# Patient Record
Sex: Female | Born: 1950 | Race: Black or African American | Hispanic: No | Smoking: Former smoker
Health system: Southern US, Community
[De-identification: ages and names within clinical notes are randomized; demographics above are authoritative.]

## PROBLEM LIST (undated history)

## (undated) DIAGNOSIS — E119 Type 2 diabetes mellitus without complications: Secondary | ICD-10-CM

## (undated) HISTORY — DX: Type 2 diabetes mellitus without complications: E11.9

---

## 2001-03-17 ENCOUNTER — Other Ambulatory Visit: Admission: RE | Admit: 2001-03-17 | Discharge: 2001-03-17 | Payer: Self-pay | Admitting: Obstetrics and Gynecology

## 2002-07-18 ENCOUNTER — Other Ambulatory Visit: Admission: RE | Admit: 2002-07-18 | Discharge: 2002-07-18 | Payer: Self-pay | Admitting: Obstetrics and Gynecology

## 2003-09-05 ENCOUNTER — Other Ambulatory Visit: Admission: RE | Admit: 2003-09-05 | Discharge: 2003-09-05 | Payer: Self-pay | Admitting: Obstetrics and Gynecology

## 2012-07-27 ENCOUNTER — Ambulatory Visit (INDEPENDENT_AMBULATORY_CARE_PROVIDER_SITE_OTHER): Payer: Federal, State, Local not specified - PPO | Admitting: Internal Medicine

## 2012-07-27 VITALS — BP 120/68 | HR 92 | Temp 98.5°F | Resp 16 | Ht 64.0 in | Wt 196.4 lb

## 2012-07-27 DIAGNOSIS — M549 Dorsalgia, unspecified: Secondary | ICD-10-CM

## 2012-07-27 DIAGNOSIS — Z6833 Body mass index (BMI) 33.0-33.9, adult: Secondary | ICD-10-CM

## 2012-07-27 DIAGNOSIS — E119 Type 2 diabetes mellitus without complications: Secondary | ICD-10-CM

## 2012-07-27 DIAGNOSIS — IMO0001 Reserved for inherently not codable concepts without codable children: Secondary | ICD-10-CM

## 2012-07-27 MED ORDER — MELOXICAM 15 MG PO TABS: 15.0000 mg | ORAL_TABLET | Freq: Every day | ORAL | Status: AC

## 2012-07-27 MED ORDER — CYCLOBENZAPRINE HCL 10 MG PO TABS: 10.0000 mg | ORAL_TABLET | Freq: Every day | ORAL | Status: AC

## 2012-07-27 NOTE — Progress Notes (Signed)
  Subjective:    Patient ID: Hannah Miller, female    DOB: Nov 23, 1951, 61 y.o.   MRN: [id number]  HPI Pain in the low back started 4 days ago after funny movement work. Got better with stretching exercises, but over the weekend she tried to clean her house and get rid of old things and the pain came back.No radiation. Stiffens at night. Pain worse standing. No chronic back issues   Past medical history-diabetes and hyperlipidemia care for by Dr. Talmage Nap Review of Systems     Objective:   Physical Exam Vital signs stable except obesity Tender over the left iliac crest/worse with reaching Straight leg raise negative bilaterally Flexion and extension not painful Stiff with immediate gait but loosens up       Assessment & Plan:  Problem #1 mild lumbar strain 2 continue exercises Meds ordered this encounter  Medications  . metFORMIN (GLUCOPHAGE) 1000 MG tablet    Sig: Take 1,000 mg by mouth 2 (two) times daily with a meal.  . lisinopril (PRINIVIL,ZESTRIL) 5 MG tablet    Sig: Take 5 mg by mouth daily.  . pravastatin (PRAVACHOL) 10 MG tablet    Sig: Take 10 mg by mouth daily.  . insulin lispro protamine-insulin lispro (HUMALOG 75/25) (75-25) 100 UNIT/ML SUSP    Sig: Inject into the skin.  Marland Kitchen aspirin 81 MG tablet    Sig: Take 81 mg by mouth daily.  . Vitamin D, Ergocalciferol, (DRISDOL) 50000 UNITS CAPS    Sig: Take 50,000 Units by mouth. Dr. Valera Castle care provides all these-Balan   Written today:  . cyclobenzaprine (FLEXERIL) 10 MG tablet    Sig: Take 1 tablet (10 mg total) by mouth at bedtime.    Dispense:  15 tablet    Refill:  0  . meloxicam (MOBIC) 15 MG tablet    Sig: Take 1 tablet (15 mg total) by mouth daily.    Dispense:  21 tablet    Refill:  0  30 minutes of heat at bedtime

## 2014-11-18 ENCOUNTER — Ambulatory Visit (INDEPENDENT_AMBULATORY_CARE_PROVIDER_SITE_OTHER): Payer: Federal, State, Local not specified - PPO | Admitting: Family Medicine

## 2014-11-18 ENCOUNTER — Telehealth: Payer: Self-pay | Admitting: Internal Medicine

## 2014-11-18 VITALS — BP 138/88 | HR 77 | Temp 97.6°F | Resp 16 | Ht 65.0 in | Wt 198.0 lb

## 2014-11-18 DIAGNOSIS — R5383 Other fatigue: Secondary | ICD-10-CM

## 2014-11-18 DIAGNOSIS — F32A Depression, unspecified: Secondary | ICD-10-CM

## 2014-11-18 DIAGNOSIS — G4700 Insomnia, unspecified: Secondary | ICD-10-CM

## 2014-11-18 DIAGNOSIS — F329 Major depressive disorder, single episode, unspecified: Secondary | ICD-10-CM

## 2014-11-18 DIAGNOSIS — E119 Type 2 diabetes mellitus without complications: Secondary | ICD-10-CM

## 2014-11-18 DIAGNOSIS — Z794 Long term (current) use of insulin: Secondary | ICD-10-CM

## 2014-11-18 DIAGNOSIS — IMO0001 Reserved for inherently not codable concepts without codable children: Secondary | ICD-10-CM

## 2014-11-18 MED ORDER — CLONAZEPAM 0.5 MG PO TABS: 0.5000 mg | ORAL_TABLET | Freq: Two times a day (BID) | ORAL | Status: DC | PRN

## 2014-11-18 NOTE — Patient Instructions (Addendum)
Consider seeing a counsellor: George Hugh [phone] Vivia Budge [phone] Hortense Ramal [phone]  Take clonazepam 0.5 mg 1 at bedtime for sleep. If you are tired and think you can go to sleep, try to avoid using the medicines were not necessary.  If you are not improving we will consider adding 1 or 2 other medications to your regimen.  Try to get regular exercise  Avoid all caffeine  Insomnia Insomnia is frequent trouble falling and/or staying asleep. Insomnia can be a long term problem or a short term problem. Both are common. Insomnia can be a short term problem when the wakefulness is related to a certain stress or worry. Long term insomnia is often related to ongoing stress during waking hours and/or poor sleeping habits. Overtime, sleep deprivation itself can make the problem worse. Every little thing feels more severe because you are overtired and your ability to cope is decreased. CAUSES   Stress, anxiety, and depression.  Poor sleeping habits.  Distractions such as TV in the bedroom.  Naps close to bedtime.  Engaging in emotionally charged conversations before bed.  Technical reading before sleep.  Alcohol and other sedatives. They may make the problem worse. They can hurt normal sleep patterns and normal dream activity.  Stimulants such as caffeine for several hours prior to bedtime.  Pain syndromes and shortness of breath can cause insomnia.  Exercise late at night.  Changing time zones may cause sleeping problems (jet lag). It is sometimes helpful to have someone observe your sleeping patterns. They should look for periods of not breathing during the night (sleep apnea). They should also look to see how long those periods last. If you live alone or observers are uncertain, you can also be observed at a sleep clinic where your sleep patterns will be professionally monitored. Sleep apnea requires a checkup and treatment. Give your caregivers  your medical history. Give your caregivers observations your family has made about your sleep.  SYMPTOMS   Not feeling rested in the morning.  Anxiety and restlessness at bedtime.  Difficulty falling and staying asleep. TREATMENT   Your caregiver may prescribe treatment for an underlying medical disorders. Your caregiver can give advice or help if you are using alcohol or other drugs for self-medication. Treatment of underlying problems will usually eliminate insomnia problems.  Medications can be prescribed for short time use. They are generally not recommended for lengthy use.  Over-the-counter sleep medicines are not recommended for lengthy use. They can be habit forming.  You can promote easier sleeping by making lifestyle changes such as:  Using relaxation techniques that help with breathing and reduce muscle tension.  Exercising earlier in the day.  Changing your diet and the time of your last meal. No night time snacks.  Establish a regular time to go to bed.  Counseling can help with stressful problems and worry.  Soothing music and white noise may be helpful if there are background noises you cannot remove.  Stop tedious detailed work at least one hour before bedtime. HOME CARE INSTRUCTIONS   Keep a diary. Inform your caregiver about your progress. This includes any medication side effects. See your caregiver regularly. Take note of:  Times when you are asleep.  Times when you are awake during the night.  The quality of your sleep.  How you feel the next day. This information will help your caregiver care for you.  Get out of bed if you are still awake after 15 minutes. Read  or do some quiet activity. Keep the lights down. Wait until you feel sleepy and go back to bed.  Keep regular sleeping and waking hours. Avoid naps.  Exercise regularly.  Avoid distractions at bedtime. Distractions include watching television or engaging in any intense or detailed  activity like attempting to balance the household checkbook.  Develop a bedtime ritual. Keep a familiar routine of bathing, brushing your teeth, climbing into bed at the same time each night, listening to soothing music. Routines increase the success of falling to sleep faster.  Use relaxation techniques. This can be using breathing and muscle tension release routines. It can also include visualizing peaceful scenes. You can also help control troubling or intruding thoughts by keeping your mind occupied with boring or repetitive thoughts like the old concept of counting sheep. You can make it more creative like imagining planting one beautiful flower after another in your backyard garden.  During your day, work to eliminate stress. When this is not possible use some of the previous suggestions to help reduce the anxiety that accompanies stressful situations. MAKE SURE YOU:   Understand these instructions.  Will watch your condition.  Will get help right away if you are not doing well or get worse. Document Released: 11/07/2000 Document Revised: 02/02/2012 Document Reviewed: 12/08/2007 [facility] Patient Information 2015 [place], [place]. This information is not intended to replace advice given to you by your health care provider. Make sure you discuss any questions you have with your health care provider.

## 2014-11-18 NOTE — Telephone Encounter (Signed)
Error

## 2014-11-18 NOTE — Progress Notes (Signed)
Subjective: 63 year old lady who is here because of her problems sleeping. She has a long history of troubles with this intermittently. She saw Dr. Laney Pastor about this 6 years ago, was treated with some clonazepam. She has been under a lot of work stress, as she was back then. She is diabetic and sees Dr. Chalmers Cater as her primary diabetic doctor. She lives alone. Does not know if she snores. She is awake off and on all through the night. Usually awakens before her alarm clock. Goes to bed at 11:00 in is trying to get up and around 6 AM. She does not smoke she does not drink alcohol. She has not had sleeping medications for a long time. She has tried treating herself unsuccessfully. She does admit to depression, cries fairly often. She is active in her church. Has not been seeing any counselor or psychologist. She does not get any regular physical exercise  She does like to read  Objective: Pleasant lady in no major distress today. Alert and oriented. Moderately overweight. Her neck is supple without nodes or thyromegaly. Chest clear. Heart rate without murmurs.  Assessment: Insomnia Depression Type 2 diabetes mellitus Fatigue  Plan: Stressed the need for regular exercise. She should use regular sleep habits, and we gave her a handout on this. Give clonazepam 0.5 mg at bedtime. If she's not doing better with that consider adding an SSRI for depression and/or some trazodone in addition to the SSRI from sleep purposes. She should return in about a month to review how she is doing and decide whether we need to arrange a longer-term sleep plan.  She apparently hopes to be able to retire in a year or 2, showing get away from the stresses that she feels constantly under. Does not have any major plans for in retirement as of now.  Recommend considering counseling and gave her several names that she can contact if she wishes to, or she can find someone else

## 2015-04-24 ENCOUNTER — Ambulatory Visit (INDEPENDENT_AMBULATORY_CARE_PROVIDER_SITE_OTHER): Payer: Federal, State, Local not specified - PPO | Admitting: Internal Medicine

## 2015-04-24 ENCOUNTER — Ambulatory Visit (INDEPENDENT_AMBULATORY_CARE_PROVIDER_SITE_OTHER): Payer: Federal, State, Local not specified - PPO

## 2015-04-24 VITALS — BP 124/78 | HR 67 | Temp 98.6°F | Resp 16 | Ht 65.0 in | Wt 201.8 lb

## 2015-04-24 DIAGNOSIS — M545 Low back pain, unspecified: Secondary | ICD-10-CM

## 2015-04-24 MED ORDER — CYCLOBENZAPRINE HCL 10 MG PO TABS: 10.0000 mg | ORAL_TABLET | Freq: Every day | ORAL | Status: DC

## 2015-04-24 MED ORDER — MELOXICAM 15 MG PO TABS: 15.0000 mg | ORAL_TABLET | Freq: Every day | ORAL | Status: DC

## 2015-04-24 NOTE — Progress Notes (Addendum)
   Subjective:    Patient ID: Hannah Miller, female    DOB: 12/20/1950, 64 y.o.   MRN: [id number] This chart was scribed for Tami Lin, MD by Marti Sleigh, Medical Scribe. This patient was seen in Room 5 and the patient's care was started a 6:39 PM.  Chief Complaint  Patient presents with  . Follow-up    nerve in lower back    HPI HPI Comments: Hannah Miller is a 64 y.o. female with a past hx of back pain who presents to [facility] complaining of back pain at the base of her back for the last six months. Pt states her back pain is different this time than it has been in the past. Pt states when she walks or stands for an extended period she has pain and her back locks up. Pt states she has to sit down to release her back. Pt states she has no difficulty getting in or our of the car. Pt states she has no pain with sitting. Pt denies balance issues, numbness in extremities, or burning in feet.   No gu issues iDDM stable tho recent incr ins needed perPCP  Review of Systems  Constitutional: Negative for fever, chills and unexpected weight change.  Respiratory: Negative for shortness of breath.   Cardiovascular: Negative for chest pain and leg swelling.  Gastrointestinal: Negative for abdominal pain and constipation.  Genitourinary: Negative for difficulty urinating.  Musculoskeletal: Positive for back pain. Negative for arthralgias and neck pain.  Neurological: Negative for weakness and numbness.       Objective:   Physical Exam  Constitutional: She is oriented to person, place, and time. She appears well-developed and well-nourished. No distress.  HENT:  Head: Normocephalic and atraumatic.  Eyes: EOM are normal. Pupils are equal, round, and reactive to light.  Neck: Normal range of motion. Neck supple.  Cardiovascular: Normal rate.   Pulmonary/Chest: Effort normal. No respiratory distress.  Musculoskeletal:  Only sl tend over lumbar--pain with forward bend Twist/extens good SLR  90 bilat No sens/mot losses in extr DTRs 0/0 patellar and ankle Gait wnl  Neurological: She is alert and oriented to person, place, and time. No cranial nerve deficit. Coordination normal.  Skin: Skin is warm and dry. She is not diaphoretic.  Psychiatric: She has a normal mood and affect. Her behavior is normal.  Nursing note and vitals reviewed.  [facility] reading (PRIMARY) by  Dr. Naheim Burgen=No bony lesions--?gallstone RUQ- i informed her-is asymptomat  Wt Readings from Last 3 Encounters:  04/24/15 201 lb 12.8 oz (91.536 kg)  11/18/14 198 lb (89.812 kg)  07/27/12 196 lb 6.4 oz (89.086 kg)        Assessment & Plan:  Pain of lumbar spine - Plan: DG Lumbar Spine Complete  Meds ordered this encounter  Medications  . cyclobenzaprine (FLEXERIL) 10 MG tablet    Sig: Take 1 tablet (10 mg total) by mouth at bedtime.    Dispense:  30 tablet    Refill:  0  . meloxicam (MOBIC) 15 MG tablet    Sig: Take 1 tablet (15 mg total) by mouth daily.    Dispense:  30 tablet    Refill:  0   Stretching bid Heat 30 bid If not well 2 weeks ref to PT(can't garden or lean over to wash dishes)  I have completed the patient encounter in its entirety as documented by the scribe, with editing by me where necessary. Sayler Mickiewicz P. Laney Pastor, M.D.

## 2015-05-30 ENCOUNTER — Other Ambulatory Visit: Payer: Self-pay | Admitting: Internal Medicine

## 2017-03-06 DIAGNOSIS — E559 Vitamin D deficiency, unspecified: Secondary | ICD-10-CM | POA: Diagnosis not present

## 2017-03-06 DIAGNOSIS — E1165 Type 2 diabetes mellitus with hyperglycemia: Secondary | ICD-10-CM | POA: Diagnosis not present

## 2017-03-06 DIAGNOSIS — E7800 Pure hypercholesterolemia, unspecified: Secondary | ICD-10-CM | POA: Diagnosis not present

## 2017-03-06 DIAGNOSIS — I10 Essential (primary) hypertension: Secondary | ICD-10-CM | POA: Diagnosis not present

## 2017-04-30 DIAGNOSIS — R109 Unspecified abdominal pain: Secondary | ICD-10-CM | POA: Diagnosis not present

## 2017-04-30 DIAGNOSIS — M25511 Pain in right shoulder: Secondary | ICD-10-CM | POA: Diagnosis not present

## 2017-04-30 DIAGNOSIS — M542 Cervicalgia: Secondary | ICD-10-CM | POA: Diagnosis not present

## 2017-08-04 DIAGNOSIS — E1165 Type 2 diabetes mellitus with hyperglycemia: Secondary | ICD-10-CM | POA: Diagnosis not present

## 2017-08-04 DIAGNOSIS — I10 Essential (primary) hypertension: Secondary | ICD-10-CM | POA: Diagnosis not present

## 2017-08-04 DIAGNOSIS — E559 Vitamin D deficiency, unspecified: Secondary | ICD-10-CM | POA: Diagnosis not present

## 2017-08-04 DIAGNOSIS — E7800 Pure hypercholesterolemia, unspecified: Secondary | ICD-10-CM | POA: Diagnosis not present

## 2017-09-07 DIAGNOSIS — E7800 Pure hypercholesterolemia, unspecified: Secondary | ICD-10-CM | POA: Diagnosis not present

## 2017-09-07 DIAGNOSIS — I10 Essential (primary) hypertension: Secondary | ICD-10-CM | POA: Diagnosis not present

## 2017-09-07 DIAGNOSIS — E559 Vitamin D deficiency, unspecified: Secondary | ICD-10-CM | POA: Diagnosis not present

## 2017-09-07 DIAGNOSIS — E1165 Type 2 diabetes mellitus with hyperglycemia: Secondary | ICD-10-CM | POA: Diagnosis not present

## 2018-02-25 DIAGNOSIS — I10 Essential (primary) hypertension: Secondary | ICD-10-CM | POA: Diagnosis not present

## 2018-02-25 DIAGNOSIS — E559 Vitamin D deficiency, unspecified: Secondary | ICD-10-CM | POA: Diagnosis not present

## 2018-02-25 DIAGNOSIS — E1165 Type 2 diabetes mellitus with hyperglycemia: Secondary | ICD-10-CM | POA: Diagnosis not present

## 2018-02-25 DIAGNOSIS — E7800 Pure hypercholesterolemia, unspecified: Secondary | ICD-10-CM | POA: Diagnosis not present

## 2018-05-24 DIAGNOSIS — I10 Essential (primary) hypertension: Secondary | ICD-10-CM | POA: Diagnosis not present

## 2018-05-24 DIAGNOSIS — E1165 Type 2 diabetes mellitus with hyperglycemia: Secondary | ICD-10-CM | POA: Diagnosis not present

## 2018-05-24 DIAGNOSIS — E559 Vitamin D deficiency, unspecified: Secondary | ICD-10-CM | POA: Diagnosis not present

## 2018-05-24 DIAGNOSIS — E7800 Pure hypercholesterolemia, unspecified: Secondary | ICD-10-CM | POA: Diagnosis not present

## 2018-07-29 DIAGNOSIS — E1165 Type 2 diabetes mellitus with hyperglycemia: Secondary | ICD-10-CM | POA: Diagnosis not present

## 2018-07-29 DIAGNOSIS — E559 Vitamin D deficiency, unspecified: Secondary | ICD-10-CM | POA: Diagnosis not present

## 2018-07-29 DIAGNOSIS — Z139 Encounter for screening, unspecified: Secondary | ICD-10-CM | POA: Diagnosis not present

## 2018-07-29 DIAGNOSIS — I10 Essential (primary) hypertension: Secondary | ICD-10-CM | POA: Diagnosis not present

## 2018-07-29 DIAGNOSIS — E7800 Pure hypercholesterolemia, unspecified: Secondary | ICD-10-CM | POA: Diagnosis not present

## 2018-09-02 ENCOUNTER — Other Ambulatory Visit: Payer: Self-pay | Admitting: Endocrinology

## 2018-09-02 DIAGNOSIS — Z1231 Encounter for screening mammogram for malignant neoplasm of breast: Secondary | ICD-10-CM

## 2018-10-27 ENCOUNTER — Other Ambulatory Visit: Payer: Self-pay | Admitting: Endocrinology

## 2018-10-27 DIAGNOSIS — M810 Age-related osteoporosis without current pathological fracture: Secondary | ICD-10-CM

## 2018-12-09 DIAGNOSIS — E7800 Pure hypercholesterolemia, unspecified: Secondary | ICD-10-CM | POA: Diagnosis not present

## 2018-12-09 DIAGNOSIS — I10 Essential (primary) hypertension: Secondary | ICD-10-CM | POA: Diagnosis not present

## 2018-12-09 DIAGNOSIS — E559 Vitamin D deficiency, unspecified: Secondary | ICD-10-CM | POA: Diagnosis not present

## 2018-12-09 DIAGNOSIS — E1165 Type 2 diabetes mellitus with hyperglycemia: Secondary | ICD-10-CM | POA: Diagnosis not present

## 2018-12-15 DIAGNOSIS — E7800 Pure hypercholesterolemia, unspecified: Secondary | ICD-10-CM | POA: Diagnosis not present

## 2018-12-15 DIAGNOSIS — E559 Vitamin D deficiency, unspecified: Secondary | ICD-10-CM | POA: Diagnosis not present

## 2018-12-15 DIAGNOSIS — E1165 Type 2 diabetes mellitus with hyperglycemia: Secondary | ICD-10-CM | POA: Diagnosis not present

## 2018-12-27 ENCOUNTER — Ambulatory Visit (HOSPITAL_COMMUNITY)
Admission: EM | Admit: 2018-12-27 | Discharge: 2018-12-27 | Disposition: A | Discharge status: Home / Self Care | Payer: Medicare Other | Attending: Family Medicine | Admitting: Family Medicine

## 2018-12-27 ENCOUNTER — Encounter (HOSPITAL_COMMUNITY): Payer: Self-pay | Admitting: Emergency Medicine

## 2018-12-27 ENCOUNTER — Other Ambulatory Visit: Payer: Self-pay

## 2018-12-27 DIAGNOSIS — J069 Acute upper respiratory infection, unspecified: Secondary | ICD-10-CM

## 2018-12-27 MED ORDER — HYDROCODONE-HOMATROPINE 5-1.5 MG/5ML PO SYRP: 5.0000 mL | ORAL_SOLUTION | Freq: Four times a day (QID) | ORAL | 0 refills | Status: DC | PRN

## 2018-12-27 MED ORDER — ONDANSETRON 4 MG PO TBDP
4.0000 mg | ORAL_TABLET | Freq: Once | ORAL | Status: AC
  Administered 2018-12-27: 4 mg via ORAL

## 2018-12-27 MED ORDER — ONDANSETRON 4 MG PO TBDP: 4.0000 mg | ORAL_TABLET | Freq: Three times a day (TID) | ORAL | 0 refills | Status: DC | PRN

## 2018-12-27 MED ORDER — ONDANSETRON 4 MG PO TBDP
ORAL_TABLET | ORAL | Status: AC
  Filled 2018-12-27: qty 1

## 2018-12-27 NOTE — ED Triage Notes (Signed)
Pt reports a cough that started on Friday.  Pt states she has since developed nausea, vomiting, and dizziness with a headache.  Pt has not had a fever.

## 2018-12-27 NOTE — ED Provider Notes (Signed)
Hannah Miller    CSN: [id number] Arrival date & time: 12/27/18  1248     History   Chief Complaint Chief Complaint  Patient presents with  . URI    HPI Hannah Miller is a 68 y.o. female.  Patient has had dry cough with nausea vomiting diarrhea and headache.  Symptoms started about 3 days ago.  She denies myalgias sore throat.  She has not used any OTC medicines   HPI  Past Medical History:  Diagnosis Date  . Diabetes mellitus without complication [facility])     Patient Active Problem List   Diagnosis Date Noted  . IDDM (insulin dependent diabetes mellitus) ([place]) 07/27/2012  . BMI 33.0-33.9,adult 07/27/2012    History reviewed. No pertinent surgical history.  OB History   No obstetric history on file.      Home Medications    Prior to Admission medications   Medication Sig Start Date End Date Taking? Authorizing Provider  aspirin 81 MG tablet Take 81 mg by mouth daily.   Yes [provider]  insulin lispro protamine-insulin lispro (HUMALOG 75/25) (75-25) 100 UNIT/ML SUSP Inject into the skin.   Yes [provider]  lisinopril (PRINIVIL,ZESTRIL) 5 MG tablet Take 5 mg by mouth daily.   Yes [provider]  metFORMIN (GLUCOPHAGE) 1000 MG tablet Take 1,000 mg by mouth 2 (two) times daily with a meal.   Yes [provider]  pravastatin (PRAVACHOL) 10 MG tablet Take 10 mg by mouth daily.   Yes [provider]  Vitamin D, Ergocalciferol, (DRISDOL) 50000 UNITS CAPS Take 50,000 Units by mouth.   Yes [provider]  cyclobenzaprine (FLEXERIL) 10 MG tablet Take 1 tablet (10 mg total) by mouth at bedtime. 04/24/15   Leandrew Koyanagi, MD  meloxicam (MOBIC) 15 MG tablet Take 1 tablet (15 mg total) by mouth daily. 04/24/15   Leandrew Koyanagi, MD    Family History History reviewed. No pertinent family history.  Social History Social History   Tobacco Use  . Smoking status: Former Smoker  Substance Use Topics    . Alcohol use: Not on file  . Drug use: Not on file     Allergies   Patient has no known allergies.   Review of Systems Review of Systems  Constitutional: Positive for fever.  Respiratory: Positive for cough.   Gastrointestinal: Positive for diarrhea, nausea and vomiting.  All other systems reviewed and are negative.    Physical Exam Triage Vital Signs ED Triage Vitals  Enc Vitals Group     BP 12/27/18 1438 110/79     Pulse Rate 12/27/18 1438 85     Resp --      Temp 12/27/18 1438 99.3 F (37.4 C)     Temp Source 12/27/18 1438 Oral     SpO2 12/27/18 1438 98 %     Weight --      Height --      Head Circumference --      Peak Flow --      Pain Score 12/27/18 1439 0     Pain Loc --      Pain Edu? --      Excl. in [place]? --    No data found.  Updated Vital Signs BP 110/79 (BP Location: Right Arm)   Pulse 85   Temp 99.3 F (37.4 C) (Oral)   SpO2 98%   Visual Acuity Right Eye Distance:   Left Eye Distance:   Bilateral Distance:  Right Eye Near:   Left Eye Near:    Bilateral Near:     Physical Exam Vitals signs and nursing note reviewed. Exam conducted with a chaperone present.  Constitutional:      Appearance: Normal appearance. She is obese.  HENT:     Head: Normocephalic.     Right Ear: Tympanic membrane normal.     Left Ear: Tympanic membrane normal.     Nose: Nose normal.     Mouth/Throat:     Mouth: Mucous membranes are moist.     Pharynx: Oropharynx is clear.  Neck:     Musculoskeletal: Normal range of motion and neck supple.  Cardiovascular:     Rate and Rhythm: Normal rate and regular rhythm.     Heart sounds: Normal heart sounds.  Pulmonary:     Effort: Pulmonary effort is normal.     Breath sounds: Normal breath sounds.  Abdominal:     General: Bowel sounds are normal.  Neurological:     General: No focal deficit present.     Mental Status: She is alert and oriented to person, place, and time.      UC Treatments / Results   Labs (all labs ordered are listed, but only abnormal results are displayed) Labs Reviewed - No data to display  EKG None  Radiology No results found.  Procedures Procedures (including critical care time)  Medications Ordered in UC Medications  ondansetron (ZOFRAN-ODT) disintegrating tablet 4 mg (has no administration in time range)    Initial Impression / Assessment and Plan / UC Course  I have reviewed the triage vital signs and the nursing notes.  Pertinent labs & imaging results that were available during my care of the patient were reviewed by me and considered in my medical decision making (see chart for details).     Viral syndrome.  Will treat symptoms Final Clinical Impressions(s) / UC Diagnoses   Final diagnoses:  None   Discharge Instructions   None    ED Prescriptions    None     Controlled Substance Prescriptions [place] Controlled Substance Registry consulted? Yes, I have consulted the [place] Controlled Substances Registry for this patient, and feel the risk/benefit ratio today is favorable for proceeding with this prescription for a controlled substance.   Wardell Honour, MD 12/27/18 [phone]

## 2019-01-05 ENCOUNTER — Ambulatory Visit: Payer: Self-pay

## 2019-01-05 ENCOUNTER — Other Ambulatory Visit: Payer: Self-pay

## 2020-07-30 ENCOUNTER — Ambulatory Visit (HOSPITAL_COMMUNITY)
Admission: EM | Admit: 2020-07-30 | Discharge: 2020-07-30 | Disposition: A | Discharge status: Home / Self Care | Payer: Medicare Other | Attending: Physician Assistant | Admitting: Physician Assistant

## 2020-07-30 ENCOUNTER — Encounter (HOSPITAL_COMMUNITY): Payer: Self-pay

## 2020-07-30 ENCOUNTER — Other Ambulatory Visit: Payer: Self-pay

## 2020-07-30 DIAGNOSIS — M545 Low back pain, unspecified: Secondary | ICD-10-CM

## 2020-07-30 MED ORDER — TIZANIDINE HCL 2 MG PO TABS: 2.0000 mg | ORAL_TABLET | Freq: Three times a day (TID) | ORAL | 0 refills | Status: DC | PRN

## 2020-07-30 MED ORDER — MELOXICAM 15 MG PO TABS: 15.0000 mg | ORAL_TABLET | Freq: Every day | ORAL | 0 refills | Status: DC

## 2020-07-30 NOTE — ED Triage Notes (Addendum)
Pt is here with omn & off back pain that she has had for 20 years, pt has not  taken any meds to relieve discomfort.

## 2020-07-30 NOTE — ED Provider Notes (Signed)
[place]    CSN: [id number] Arrival date & time: 07/30/20  2841      History   Chief Complaint Chief Complaint  Patient presents with  . Back Pain    HPI Hannah Miller is a 69 y.o. female.   69 year old female comes in for a chronic right lower back pain.  Patient states this has been intermittent for the past 20 years, with 3 episodes of flareups.  Current episode has been for a few months.  No injury or trauma.  In the past, has done NSAIDs, muscle relaxers, PT with relief.  States in the past few months, she has been trying to do stretches and has not improved symptoms.  Pain is to the right lower back with shooting and burning sensation towards the hip area.  Denies radiation down the leg, numbness, tingling, saddle anesthesia, loss of bladder or bowel control.     Past Medical History:  Diagnosis Date  . Diabetes mellitus without complication [facility])     Patient Active Problem List   Diagnosis Date Noted  . IDDM (insulin dependent diabetes mellitus) 07/27/2012  . BMI 33.0-33.9,adult 07/27/2012    History reviewed. No pertinent surgical history.  OB History   No obstetric history on file.      Home Medications    Prior to Admission medications   Medication Sig Start Date End Date Taking? Authorizing Provider  aspirin 81 MG tablet Take 81 mg by mouth daily.    [provider]  insulin lispro protamine-insulin lispro (HUMALOG 75/25) (75-25) 100 UNIT/ML SUSP Inject into the skin.    [provider]  lisinopril (PRINIVIL,ZESTRIL) 5 MG tablet Take 5 mg by mouth daily.    [provider]  meloxicam (MOBIC) 15 MG tablet Take 1 tablet (15 mg total) by mouth daily. 07/30/20   Tasia Catchings, Griselda Tosh V, PA-C  pravastatin (PRAVACHOL) 10 MG tablet Take 10 mg by mouth daily.    [provider]  tiZANidine (ZANAFLEX) 2 MG tablet Take 1 tablet (2 mg total) by mouth every 8 (eight) hours as needed for muscle spasms. 07/30/20   Tasia Catchings, Zamia Tyminski V, PA-C    Vitamin D, Ergocalciferol, (DRISDOL) 50000 UNITS CAPS Take 50,000 Units by mouth.    [provider]  metFORMIN (GLUCOPHAGE) 1000 MG tablet Take 1,000 mg by mouth 2 (two) times daily with a meal.  07/30/20  [provider]    Family History Family History  Problem Relation Age of Onset  . Healthy Mother   . Healthy Father     Social History Social History   Tobacco Use  . Smoking status: Former Smoker    Types: Cigarettes  . Smokeless tobacco: Never Used  Substance Use Topics  . Alcohol use: Not Currently  . Drug use: Never     Allergies   Patient has no known allergies.   Review of Systems Review of Systems  Reason unable to perform ROS: See HPI as above.     Physical Exam Triage Vital Signs ED Triage Vitals [07/30/20 1014]  Enc Vitals Group     BP (!) 152/70     Pulse Rate 77     Resp 19     Temp 98.8 F (37.1 C)     Temp Source Oral     SpO2 99 %     Weight      Height      Head Circumference      Peak Flow  Pain Score      Pain Loc      Pain Edu?      Excl. in [place]?    No data found.  Updated Vital Signs BP (!) 152/70 (BP Location: Right Arm)   Pulse 77   Temp 98.8 F (37.1 C) (Oral)   Resp 19   SpO2 99%   Physical Exam Constitutional:      General: She is not in acute distress.    Appearance: She is well-developed. She is not diaphoretic.  HENT:     Head: Normocephalic and atraumatic.  Eyes:     Conjunctiva/sclera: Conjunctivae normal.     Pupils: Pupils are equal, round, and reactive to light.  Cardiovascular:     Rate and Rhythm: Normal rate and regular rhythm.     Heart sounds: Normal heart sounds. No murmur heard.  No friction rub. No gallop.   Pulmonary:     Effort: Pulmonary effort is normal. No accessory muscle usage or respiratory distress.     Breath sounds: Normal breath sounds. No stridor. No decreased breath sounds, wheezing, rhonchi or rales.  Musculoskeletal:     Comments: No tenderness on  palpation of the spinous processes. Tenderness to palpation along right lumbar region. No tenderness to the hip. Full range of motion of back/hips. Strength normal and equal bilaterally. Sensation intact and equal bilaterally. Negative straight leg raise.  Skin:    General: Skin is warm and dry.  Neurological:     Mental Status: She is alert and oriented to person, place, and time.      UC Treatments / Results  Labs (all labs ordered are listed, but only abnormal results are displayed) Labs Reviewed - No data to display  EKG   Radiology No results found.  Procedures Procedures (including critical care time)  Medications Ordered in UC Medications - No data to display  Initial Impression / Assessment and Plan / UC Course  I have reviewed the triage vital signs and the nursing notes.  Pertinent labs & imaging results that were available during my care of the patient were reviewed by me and considered in my medical decision making (see chart for details).    NSAID as directed. Muscle relaxant as needed. Ice/heat compresses. Expected course of healing discussed. Return precautions given.   Final Clinical Impressions(s) / UC Diagnoses   Final diagnoses:  Acute right-sided low back pain without sciatica   ED Prescriptions    Medication Sig Dispense Auth. Provider   meloxicam (MOBIC) 15 MG tablet Take 1 tablet (15 mg total) by mouth daily. 10 tablet Sharunda Salmon V, PA-C   tiZANidine (ZANAFLEX) 2 MG tablet Take 1 tablet (2 mg total) by mouth every 8 (eight) hours as needed for muscle spasms. 15 tablet Ok Edwards, PA-C     PDMP not reviewed this encounter.   Ok Edwards, PA-C 07/30/20 1054

## 2020-07-30 NOTE — Discharge Instructions (Signed)
Start Mobic. Do not take ibuprofen (motrin/advil)/ naproxen (aleve) while on mobic. Robaxin as needed, this can make you drowsy, so do not take if you are going to drive, operate heavy machinery, or make important decisions. Ice/heat compresses as needed. Follow up with PCP/orthopedics if symptoms worsen, changes for reevaluation. If experience numbness/tingling of the inner thighs, loss of bladder or bowel control, go to the emergency department for evaluation.

## 2020-11-20 DIAGNOSIS — E1165 Type 2 diabetes mellitus with hyperglycemia: Secondary | ICD-10-CM | POA: Diagnosis not present

## 2020-11-20 DIAGNOSIS — E7800 Pure hypercholesterolemia, unspecified: Secondary | ICD-10-CM | POA: Diagnosis not present

## 2020-11-20 DIAGNOSIS — E559 Vitamin D deficiency, unspecified: Secondary | ICD-10-CM | POA: Diagnosis not present

## 2020-11-28 DIAGNOSIS — I10 Essential (primary) hypertension: Secondary | ICD-10-CM | POA: Diagnosis not present

## 2020-11-28 DIAGNOSIS — E1165 Type 2 diabetes mellitus with hyperglycemia: Secondary | ICD-10-CM | POA: Diagnosis not present

## 2020-11-28 DIAGNOSIS — E7800 Pure hypercholesterolemia, unspecified: Secondary | ICD-10-CM | POA: Diagnosis not present

## 2020-11-28 DIAGNOSIS — E559 Vitamin D deficiency, unspecified: Secondary | ICD-10-CM | POA: Diagnosis not present

## 2021-01-10 DIAGNOSIS — I10 Essential (primary) hypertension: Secondary | ICD-10-CM | POA: Diagnosis not present

## 2021-01-10 DIAGNOSIS — E559 Vitamin D deficiency, unspecified: Secondary | ICD-10-CM | POA: Diagnosis not present

## 2021-01-10 DIAGNOSIS — E7800 Pure hypercholesterolemia, unspecified: Secondary | ICD-10-CM | POA: Diagnosis not present

## 2021-01-10 DIAGNOSIS — E1165 Type 2 diabetes mellitus with hyperglycemia: Secondary | ICD-10-CM | POA: Diagnosis not present

## 2021-04-16 DIAGNOSIS — E1165 Type 2 diabetes mellitus with hyperglycemia: Secondary | ICD-10-CM | POA: Diagnosis not present

## 2021-04-16 DIAGNOSIS — I10 Essential (primary) hypertension: Secondary | ICD-10-CM | POA: Diagnosis not present

## 2021-04-16 DIAGNOSIS — E7800 Pure hypercholesterolemia, unspecified: Secondary | ICD-10-CM | POA: Diagnosis not present

## 2021-04-16 DIAGNOSIS — E559 Vitamin D deficiency, unspecified: Secondary | ICD-10-CM | POA: Diagnosis not present

## 2021-06-20 DIAGNOSIS — M5412 Radiculopathy, cervical region: Secondary | ICD-10-CM | POA: Diagnosis not present

## 2021-06-20 DIAGNOSIS — G5603 Carpal tunnel syndrome, bilateral upper limbs: Secondary | ICD-10-CM | POA: Diagnosis not present

## 2021-06-20 DIAGNOSIS — M546 Pain in thoracic spine: Secondary | ICD-10-CM | POA: Diagnosis not present

## 2021-07-01 ENCOUNTER — Ambulatory Visit: Payer: Medicare Other | Attending: Urgent Care

## 2021-07-01 ENCOUNTER — Other Ambulatory Visit: Payer: Self-pay

## 2021-07-01 DIAGNOSIS — R200 Anesthesia of skin: Secondary | ICD-10-CM | POA: Insufficient documentation

## 2021-07-01 DIAGNOSIS — M79631 Pain in right forearm: Secondary | ICD-10-CM | POA: Insufficient documentation

## 2021-07-01 DIAGNOSIS — R293 Abnormal posture: Secondary | ICD-10-CM | POA: Diagnosis not present

## 2021-07-01 DIAGNOSIS — M6281 Muscle weakness (generalized): Secondary | ICD-10-CM | POA: Diagnosis not present

## 2021-07-01 DIAGNOSIS — M79632 Pain in left forearm: Secondary | ICD-10-CM | POA: Diagnosis not present

## 2021-07-01 DIAGNOSIS — R202 Paresthesia of skin: Secondary | ICD-10-CM | POA: Insufficient documentation

## 2021-07-02 NOTE — Therapy (Signed)
[place], [place], [address] Phone: [phone]   Fax:  [phone]  Physical Therapy Evaluation  Patient Details  Name: Hannah Miller MRN: [id number] Date of Birth: 21-Oct-1951 Referring Provider (PT): Chaney Malling, [place]   Encounter Date: 07/01/2021   PT End of Session - 07/01/21 1501     Visit Number 1    Number of Visits 13    Date for PT Re-Evaluation 08/17/21    Authorization Type MCR    Progress Note Due on Visit 10    PT Start Time 1501    PT Stop Time 1545    PT Time Calculation (min) 44 min    Activity Tolerance Patient tolerated treatment well    Behavior During Therapy [facility] for tasks assessed/performed             Past Medical History:  Diagnosis Date   Diabetes mellitus without complication ([place])     History reviewed. No pertinent surgical history.  There were no vitals filed for this visit.    Subjective Assessment - 07/01/21 1502     Subjective Patient reports she has some stuff going on at C6-C7 that's affecting her hands and arms with pain and numbness. This has been going on for a couple months without known cause. She reports current numbness in the fingertip of the Rt thumb. She reports the pain is intermittent in BUE and reports most of the time she feels it along the forearms (unsure of exact location along her forearms) described as aching and burning. She reports pain at worst 10/10 first thing in the morning that is relieved with medication. The numbness and tingling is intermittent in her hands/fingers, but is also unsure of which fingers are mostly affected or if the numbness/tingling is along the palmar or dorsal aspect. She does not experience neck pain nor does she have a history neck pain. She reports history of Rt shoulder pain off and on for the past year depending on how she sleeps. She denies any changes in bowel/bladder. She reports light-headedness occasionally  attributing it to her diabetes. She was prescribed gabapentin and muscle relaxer, which is helping with the pain.    Pertinent History diabetes    Limitations Lifting    Patient Stated Goals I don't want to be in pain, I want to be able to function. I am dropping stuff.    Currently in Pain? No/denies                [facility] PT Assessment - 07/02/21 0001       Assessment   Medical Diagnosis M54.12 (ICD-10-CM) - Radiculopathy, cervical region    Referring Provider (PT) Elmer Ramp, Loree Fee L, PA    Onset Date/Surgical Date --   about 2 months ago   Hand Dominance Right    Next MD Visit 08/02/21    Prior Therapy PT for her low back      Precautions   Precautions None      Restrictions   Weight Bearing Restrictions No      Balance Screen   Has the patient fallen in the past 6 months No      [place] residence    Living Arrangements Alone    Additional Comments stairs to enter      Prior Function   Level of Independence Independent    Vocation Part time employment    Geneticist, molecular    Leisure  reading      Cognition   Overall Cognitive Status Within Functional Limits for tasks assessed      Observation/Other Assessments   Focus on Therapeutic Outcomes (FOTO)  69% function to 72% predicted      Sensation   Light Touch Appears Intact      Coordination   Gross Motor Movements are Fluid and Coordinated Yes      Posture/Postural Control   Posture/Postural Control Postural limitations    Postural Limitations Rounded Shoulders;Forward head      AROM   Overall AROM Comments shoulder AROM WNL bilaterally, with exception of mild limition into functional IR reaching bilaterally, pain free shoulder AROM    Cervical Flexion 60    Cervical Extension 35    Cervical - Right Side Bend WNL    Cervical - Left Side Bend WNL    Cervical - Right Rotation WNL    Cervical - Left Rotation WNL      Strength   Right Shoulder Flexion  4-/5    Right Shoulder ABduction 4+/5    Left Shoulder Flexion 4-/5    Left Shoulder ABduction 4+/5    Right Elbow Flexion 4+/5    Right Elbow Extension 4+/5    Left Elbow Flexion 4+/5    Left Elbow Extension 4+/5    Right Wrist Flexion 5/5    Right Wrist Extension 5/5    Left Wrist Flexion 5/5    Left Wrist Extension 5/5    Right Hand Grip (lbs) 5    Left Hand Grip (lbs) 5      Palpation   Spinal mobility Hypomobility about C spine, no reproduction of symptoms with CPAs      Special Tests   Other special tests Rt ULTT (+) median and radial nerve; Lt ULTT (+) median, ulnar, and radial; (-) Cervical compression (-) Cervical Distraction (-) Phalen's (-) Reverse Phalens (-) Tinels (-) Hoffman's                        Objective measurements completed on examination: See above findings.       [facility] Adult PT Treatment/Exercise - 07/02/21 0001       Self-Care   Self-Care Other Self-Care Comments    Other Self-Care Comments  see patient education                    PT Education - 07/02/21 [phone]     Education Details Education on assessment findings, POC, HEP.    Person(s) Educated Patient    Methods Explanation;Demonstration;Tactile cues;Verbal cues;Handout    Comprehension Verbalized understanding;Returned demonstration;Verbal cues required;Tactile cues required;Need further instruction              PT Short Term Goals - 07/01/21 1701       PT SHORT TERM GOAL #1   Title Patient will be independent with initial HEP.    Baseline issued at eval    Time 2    Period Weeks    Status New    Target Date 07/15/21      PT SHORT TERM GOAL #2   Title Therapist will review FOTO and anticipated functional progress.    Baseline FOTO captured    Time 2    Period Weeks    Status New    Target Date 07/15/21      PT SHORT TERM GOAL #3   Title Patient will demonstrate knowledge and application of proper scapulothoracic and cervical positioning  in  sitting to improve overall posture.    Baseline will begin posture education at next visit.    Time 3    Period Weeks    Status New    Target Date 07/23/21               PT Long Term Goals - 07/02/21 0925       PT LONG TERM GOAL #1   Title Patient will demonstrate at least 20 lbs of grip strength bilaterally to improve ability to carry groceries.    Baseline 5 lbs bilaterally    Time 6    Period Weeks    Status New    Target Date 08/13/21      PT LONG TERM GOAL #2   Title Patient will report no incidents of dropping items for at least 3 weeks.    Baseline reports randomly dropping items recently    Time 6    Period Weeks    Status New    Target Date 08/13/21      PT LONG TERM GOAL #3   Title Patient will have negative ULTT bilaterally indicative of improvements in current condition.    Baseline positive ULTT bilaterally    Time 6    Period Weeks    Status New    Target Date 08/13/21                    Plan - 07/02/21 0934     Clinical Impression Statement Patient is a 70 y/o female who presents to OPPT with chief complaint of intermittent bilateral hand numbness and tingling and forearm pain that began about 2 months ago without known cause. She is unable to locate the exact area of pain along her forearms and parasthesia in her hands, making it more difficult to decipher specific nerve involvement at this time. Her symptoms do not appear to coincide with cervical radiculopathy as she has full and pain free cervical AROM, negative cervical special tests, and no reproduction of symptoms with cervical PAIVM. Her symptoms also do not coincide with carpal tunnel as she has negative carpal tunnel special tests bilaterally. Further evaluation about the elbow and shoulder are warrented at next visit to determine potential musculoskeletal cause of her pain and parasthesia. She is noted to have poor posture, which is potentially a factor in causing her symptoms. She is  also noted to have mild shoulder weakness and significant grip weakness bilaterally. She will benefit from skilled PT to address the above stated deficits in order to optimize her function.    Personal Factors and Comorbidities Age;Profession;Comorbidity 1;Time since onset of injury/illness/exacerbation;Fitness    Comorbidities diabetes    Examination-Activity Limitations Lift;Carry    Examination-Participation Restrictions Occupation    Stability/Clinical Decision Making Evolving/Moderate complexity    Clinical Decision Making Moderate    Rehab Potential Good    PT Frequency 2x / week    PT Duration 6 weeks    PT Treatment/Interventions ADLs/Self Care Home Management;Cryotherapy;Electrical Stimulation;Moist Heat;Therapeutic activities;Therapeutic exercise;Neuromuscular re-education;Patient/family education;Manual techniques;Passive range of motion;Dry needling;Taping    PT Next Visit Plan Did patient note where her pain and parasthesia is located??? Further assessment of elbow/shoulder for potential involvement in symptoms. review HEP, nerve glides, posture education    PT Home Exercise Plan Access Code [email or web address]    Consulted and Agree with Plan of Care Patient             Patient will benefit from skilled therapeutic intervention in order  to improve the following deficits and impairments:  Impaired UE functional use, Pain, Postural dysfunction, Decreased strength  Visit Diagnosis: Numbness and tingling in both hands  Abnormal posture  Muscle weakness (generalized)  Pain in left forearm  Pain in right forearm     Problem List Patient Active Problem List   Diagnosis Date Noted   IDDM (insulin dependent diabetes mellitus) 07/27/2012   BMI 33.0-33.9,adult 07/27/2012   Gwendolyn Grant, PT, DPT, ATC 07/02/21 9:47 AM  [facility] [address] [place], [place], [address] Phone: [phone]   Fax:  [phone]  Name:  Sumiya Belfer MRN: [id number] Date of Birth: 13-Nov-1951

## 2021-07-09 ENCOUNTER — Ambulatory Visit: Payer: Medicare Other

## 2021-07-09 ENCOUNTER — Other Ambulatory Visit: Payer: Self-pay

## 2021-07-09 DIAGNOSIS — M6281 Muscle weakness (generalized): Secondary | ICD-10-CM | POA: Diagnosis not present

## 2021-07-09 DIAGNOSIS — M79631 Pain in right forearm: Secondary | ICD-10-CM

## 2021-07-09 DIAGNOSIS — M79632 Pain in left forearm: Secondary | ICD-10-CM | POA: Diagnosis not present

## 2021-07-09 DIAGNOSIS — R293 Abnormal posture: Secondary | ICD-10-CM

## 2021-07-09 DIAGNOSIS — R202 Paresthesia of skin: Secondary | ICD-10-CM

## 2021-07-09 DIAGNOSIS — R200 Anesthesia of skin: Secondary | ICD-10-CM

## 2021-07-09 NOTE — Therapy (Signed)
[place], [place], [address] Phone: [phone]   Fax:  [phone]  Physical Therapy Treatment  Patient Details  Name: Hannah Miller MRN: [id number] Date of Birth: 1951-02-07 Referring Provider (PT): Chaney Malling, [place]   Encounter Date: 07/09/2021   PT End of Session - 07/09/21 1445     Visit Number 2    Number of Visits 13    Date for PT Re-Evaluation 08/17/21    Authorization Type MCR    Progress Note Due on Visit 10    PT Start Time [id number]    PT Stop Time 1535    PT Time Calculation (min) 50 min    Activity Tolerance Patient tolerated treatment well    Behavior During Therapy [facility] for tasks assessed/performed             Past Medical History:  Diagnosis Date   Diabetes mellitus without complication ([place])     History reviewed. No pertinent surgical history.  There were no vitals filed for this visit.   Subjective Assessment - 07/09/21 1446     Subjective She reports the aching has been present along the posterior forearm and back of the thumb. She has numbness in digits 1-4 and the hand on the palmar/dorsal aspect. Rt>Lt.The aching pain is mostly at night or on the weekends if she is just sitting around. The numbness is intermittent throughout the day/night noticing it more when she is inactive.    Pertinent History diabetes    Limitations Lifting    Patient Stated Goals I don't want to be in pain, I want to be able to function. I am dropping stuff.    Currently in Pain? Yes    Pain Score 2     Pain Location Finger (Comment which one)    Pain Orientation Left;Right    Pain Descriptors / Indicators Numbness    Pain Type Chronic pain    Pain Onset More than a month ago    Pain Frequency Constant                [facility] PT Assessment - 07/09/21 0001       Strength   Overall Strength Comments 5/5 bilateral supination/pronation      Palpation   Palpation comment significant TTP  bilateral forearm extensor mass; bilateral GHJ hypmobility (no reproduction of symptoms), TTP bilateral posterior deltoid                           [facility] Adult PT Treatment/Exercise - 07/09/21 0001       Shoulder Exercises: Seated   Retraction 10 reps      Shoulder Exercises: Stretch   Other Shoulder Stretches doorway pec stretch 2 x 30 sec    Other Shoulder Stretches median nerve glides 1 x 5 each; radial nerve glides 1 x 5 each      Manual Therapy   Manual therapy comments STM to bilateral forearm extensor mass, manual cervical traction (no effect)                    PT Education - 07/09/21 1536     Education Details Education on UE nerve anatomy, posture education, reviewed FOTO score/anticipated progress, reviewed/updated HEP,  demonstrated/issued tennis ball for self soft tissue mobilization.    Person(s) Educated Patient    Methods Explanation;Demonstration;Tactile cues;Verbal cues;Handout    Comprehension Verbalized understanding;Returned demonstration;Verbal cues required;Tactile cues required  PT Short Term Goals - 07/09/21 1537       PT SHORT TERM GOAL #1   Title Patient will be independent with initial HEP.    Baseline min cues 8/16    Time 2    Period Weeks    Status On-going    Target Date 07/15/21      PT SHORT TERM GOAL #2   Title Therapist will review FOTO and anticipated functional progress.    Baseline reviewed 8/16    Time 2    Period Weeks    Status Achieved    Target Date 07/15/21      PT SHORT TERM GOAL #3   Title Patient will demonstrate knowledge and application of proper scapulothoracic and cervical positioning in sitting to improve overall posture.    Baseline began education    Time 3    Period Weeks    Status On-going    Target Date 07/23/21               PT Long Term Goals - 07/02/21 0925       PT LONG TERM GOAL #1   Title Patient will demonstrate at least 20 lbs of grip strength  bilaterally to improve ability to carry groceries.    Baseline 5 lbs bilaterally    Time 6    Period Weeks    Status New    Target Date 08/13/21      PT LONG TERM GOAL #2   Title Patient will report no incidents of dropping items for at least 3 weeks.    Baseline reports randomly dropping items recently    Time 6    Period Weeks    Status New    Target Date 08/13/21      PT LONG TERM GOAL #3   Title Patient will have negative ULTT bilaterally indicative of improvements in current condition.    Baseline positive ULTT bilaterally    Time 6    Period Weeks    Status New    Target Date 08/13/21                   Plan - 07/09/21 1540     Clinical Impression Statement Patient was able to attend to her location of pain since the initial evaluation reporting an ache in her posterior forearms that radiates to her posterior thumb that mostly occurs in the evenings when she is resting/inactive. She also experiences numbness in digits 1-4 (though unsure if it occurs on the palmar or dorsal aspect) that happens throughout the day, though mostly when she is inactive. Began postural education today as her symptoms could be attributed to poor posture since her pain typically occurs during periods of seated rest. She is also noted to have significant tautness in bilateral forearm extensor mass with partial release from manual therapy. Overall she tolerated session well today reporting tingling in Lt 3rd digit with doorway pec stretch. Will continue to address postural abnormalities and address tightness/hypomobility throughout the UE to reduce overall neural tension.    PT Treatment/Interventions ADLs/Self Care Home Management;Cryotherapy;Electrical Stimulation;Moist Heat;Therapeutic activities;Therapeutic exercise;Neuromuscular re-education;Patient/family education;Manual techniques;Passive range of motion;Dry needling;Taping    PT Next Visit Plan continue posture education and periscapular  strengthening, nerve glides, manual to forearms    PT Home Exercise Plan Access Code [email or web address]    Consulted and Agree with Plan of Care Patient             Patient will benefit from skilled therapeutic  intervention in order to improve the following deficits and impairments:  Impaired UE functional use, Pain, Postural dysfunction, Decreased strength  Visit Diagnosis: Numbness and tingling in both hands  Abnormal posture  Muscle weakness (generalized)  Pain in left forearm  Pain in right forearm     Problem List Patient Active Problem List   Diagnosis Date Noted   IDDM (insulin dependent diabetes mellitus) 07/27/2012   BMI 33.0-33.9,adult 07/27/2012   Gwendolyn Grant, PT, DPT, ATC 07/09/21 3:48 PM   [facility] [address] [place], [place], [address] Phone: [phone]   Fax:  [phone]  Name: Hannah Miller MRN: [id number] Date of Birth: 1951-04-20

## 2021-07-09 NOTE — Patient Instructions (Signed)

## 2021-07-16 ENCOUNTER — Ambulatory Visit: Payer: Medicare Other

## 2021-07-16 ENCOUNTER — Other Ambulatory Visit: Payer: Self-pay

## 2021-07-16 DIAGNOSIS — R200 Anesthesia of skin: Secondary | ICD-10-CM | POA: Diagnosis not present

## 2021-07-16 DIAGNOSIS — M6281 Muscle weakness (generalized): Secondary | ICD-10-CM | POA: Diagnosis not present

## 2021-07-16 DIAGNOSIS — M79631 Pain in right forearm: Secondary | ICD-10-CM | POA: Diagnosis not present

## 2021-07-16 DIAGNOSIS — R293 Abnormal posture: Secondary | ICD-10-CM

## 2021-07-16 DIAGNOSIS — M79632 Pain in left forearm: Secondary | ICD-10-CM

## 2021-07-16 DIAGNOSIS — R202 Paresthesia of skin: Secondary | ICD-10-CM

## 2021-07-16 NOTE — Therapy (Addendum)
[place], [place], [address] Phone: [phone]   Fax:  [phone]  Physical Therapy Treatment/Discharge  Patient Details  Name: Hannah Miller MRN: [id number] Date of Birth: January 01, 1951 Referring Provider (PT): Chaney Malling, [place]   Encounter Date: 07/16/2021   PT End of Session - 07/16/21 1528     Visit Number 3    Number of Visits 13    Date for PT Re-Evaluation 08/17/21    Authorization Type MCR    Progress Note Due on Visit 10    PT Start Time 0347    PT Stop Time 1613    PT Time Calculation (min) 43 min    Activity Tolerance Patient tolerated treatment well    Behavior During Therapy [facility] for tasks assessed/performed             Past Medical History:  Diagnosis Date   Diabetes mellitus without complication ([place])     History reviewed. No pertinent surgical history.  There were no vitals filed for this visit.   Subjective Assessment - 07/16/21 1529     Subjective Patient reports she is feeling about the same. She still gets the pain in posterior forearm (Rt>Lt) mostly at night. Numbness is currently in digists 1-4 on the palmar aspect.    Pertinent History diabetes    Limitations Lifting    Patient Stated Goals I don't want to be in pain, I want to be able to function. I am dropping stuff.    Currently in Pain? Yes    Pain Score 4     Pain Location Finger (Comment which one)   1-4   Pain Orientation Right;Left    Pain Descriptors / Indicators Numbness    Pain Type Chronic pain    Pain Onset More than a month ago    Pain Frequency Intermittent    Aggravating Factors  unknown    Pain Relieving Factors unknown                               [facility] Adult PT Treatment/Exercise - 07/16/21 0001       Self-Care   Other Self-Care Comments  see patient education      Neck Exercises: Seated   Neck Retraction 10 reps    Neck Retraction Limitations with extension x  10 reps    Other Seated Exercise seated thoracic extension 1 x 10      Shoulder Exercises: Seated   External Rotation 10 reps    Theraband Level (Shoulder External Rotation) Level 1 (Yellow)    External Rotation Limitations x2      Shoulder Exercises: ROM/Strengthening   UBE (Upper Arm Bike) 4 min fwd level 1.5      Shoulder Exercises: Stretch   Other Shoulder Stretches doorway pec stretch 2 x 30 sec      Manual Therapy   Manual therapy comments STM rt forearm extensor mass, STM to palmar aspect of Rt hand                    PT Education - 07/16/21 1611     Education Details Updated HEP, recommendation on sleep positioning.    Person(s) Educated Patient    Methods Explanation;Demonstration;Verbal cues;Handout    Comprehension Verbalized understanding;Returned demonstration;Verbal cues required              PT Short Term Goals - 07/09/21 1537  PT SHORT TERM GOAL #1   Title Patient will be independent with initial HEP.    Baseline min cues 8/16    Time 2    Period Weeks    Status On-going    Target Date 07/15/21      PT SHORT TERM GOAL #2   Title Therapist will review FOTO and anticipated functional progress.    Baseline reviewed 8/16    Time 2    Period Weeks    Status Achieved    Target Date 07/15/21      PT SHORT TERM GOAL #3   Title Patient will demonstrate knowledge and application of proper scapulothoracic and cervical positioning in sitting to improve overall posture.    Baseline began education    Time 3    Period Weeks    Status On-going    Target Date 07/23/21               PT Long Term Goals - 07/02/21 0925       PT LONG TERM GOAL #1   Title Patient will demonstrate at least 20 lbs of grip strength bilaterally to improve ability to carry groceries.    Baseline 5 lbs bilaterally    Time 6    Period Weeks    Status New    Target Date 08/13/21      PT LONG TERM GOAL #2   Title Patient will report no incidents of  dropping items for at least 3 weeks.    Baseline reports randomly dropping items recently    Time 6    Period Weeks    Status New    Target Date 08/13/21      PT LONG TERM GOAL #3   Title Patient will have negative ULTT bilaterally indicative of improvements in current condition.    Baseline positive ULTT bilaterally    Time 6    Period Weeks    Status New    Target Date 08/13/21                   Plan - 07/16/21 1542     Clinical Impression Statement Able to abolish the numbness in palmar aspect of  digits 1-4 on the LUE with repeated cervical retraction, though no effect on the RUE numbness. Significant tautness in the right forearm extensor mass with partial release from manual therapy. She reported feeling light-headed with pec doorway stretch that was relieved with seated rest break and peppermint. Able to progress postural strengthening with patient requiring minimal cues to decrease upper trap engagement and reduce forward shoulders.    PT Treatment/Interventions ADLs/Self Care Home Management;Cryotherapy;Electrical Stimulation;Moist Heat;Therapeutic activities;Therapeutic exercise;Neuromuscular re-education;Patient/family education;Manual techniques;Passive range of motion;Dry needling;Taping    PT Next Visit Plan continue posture education and periscapular strengthening, nerve glides, manual to forearms    PT Home Exercise Plan Access Code [email or web address]    Consulted and Agree with Plan of Care Patient             Patient will benefit from skilled therapeutic intervention in order to improve the following deficits and impairments:  Impaired UE functional use, Pain, Postural dysfunction, Decreased strength  Visit Diagnosis: Numbness and tingling in both hands  Abnormal posture  Muscle weakness (generalized)  Pain in left forearm  Pain in right forearm     Problem List Patient Active Problem List   Diagnosis Date Noted   IDDM (insulin dependent diabetes  [place]) 07/27/2012   BMI 33.0-33.9,adult 07/27/2012   Hannah Miller,  PT, DPT, ATC 07/16/21 4:16 PM  PHYSICAL THERAPY DISCHARGE SUMMARY  Visits from Start of Care: 3  Current functional level related to goals / functional outcomes: See goals above   Remaining deficits: Status unknown   Education / Equipment: N/a   Patient agrees to discharge. Patient goals were partially met. Patient is being discharged due to not returning since the last visit. Gwendolyn Grant, PT, DPT, ATC 08/19/21 2:37 PM  [place] [facility] [address] [place], [place], [address] Phone: [phone]   Fax:  [phone]  Name: Kajal Scalici MRN: [id number] Date of Birth: 09-03-1951

## 2021-07-17 DIAGNOSIS — E1169 Type 2 diabetes mellitus with other specified complication: Secondary | ICD-10-CM | POA: Diagnosis not present

## 2021-07-17 DIAGNOSIS — E559 Vitamin D deficiency, unspecified: Secondary | ICD-10-CM | POA: Diagnosis not present

## 2021-07-17 DIAGNOSIS — M546 Pain in thoracic spine: Secondary | ICD-10-CM | POA: Diagnosis not present

## 2021-07-17 DIAGNOSIS — E1165 Type 2 diabetes mellitus with hyperglycemia: Secondary | ICD-10-CM | POA: Diagnosis not present

## 2021-07-17 DIAGNOSIS — M5459 Other low back pain: Secondary | ICD-10-CM | POA: Diagnosis not present

## 2021-07-17 DIAGNOSIS — Z6832 Body mass index (BMI) 32.0-32.9, adult: Secondary | ICD-10-CM | POA: Diagnosis not present

## 2021-07-17 DIAGNOSIS — M9903 Segmental and somatic dysfunction of lumbar region: Secondary | ICD-10-CM | POA: Diagnosis not present

## 2021-07-17 DIAGNOSIS — Z79899 Other long term (current) drug therapy: Secondary | ICD-10-CM | POA: Diagnosis not present

## 2021-07-17 DIAGNOSIS — Z008 Encounter for other general examination: Secondary | ICD-10-CM | POA: Diagnosis not present

## 2021-07-17 DIAGNOSIS — R293 Abnormal posture: Secondary | ICD-10-CM | POA: Diagnosis not present

## 2021-07-17 DIAGNOSIS — M8949 Other hypertrophic osteoarthropathy, multiple sites: Secondary | ICD-10-CM | POA: Diagnosis not present

## 2021-07-17 DIAGNOSIS — F17211 Nicotine dependence, cigarettes, in remission: Secondary | ICD-10-CM | POA: Diagnosis not present

## 2021-07-17 DIAGNOSIS — M542 Cervicalgia: Secondary | ICD-10-CM | POA: Diagnosis not present

## 2021-07-17 DIAGNOSIS — I10 Essential (primary) hypertension: Secondary | ICD-10-CM | POA: Diagnosis not present

## 2021-07-17 DIAGNOSIS — M9901 Segmental and somatic dysfunction of cervical region: Secondary | ICD-10-CM | POA: Diagnosis not present

## 2021-07-17 DIAGNOSIS — M791 Myalgia, unspecified site: Secondary | ICD-10-CM | POA: Diagnosis not present

## 2021-07-17 DIAGNOSIS — Z0000 Encounter for general adult medical examination without abnormal findings: Secondary | ICD-10-CM | POA: Diagnosis not present

## 2021-07-17 DIAGNOSIS — M5136 Other intervertebral disc degeneration, lumbar region: Secondary | ICD-10-CM | POA: Diagnosis not present

## 2021-07-17 DIAGNOSIS — M50222 Other cervical disc displacement at C5-C6 level: Secondary | ICD-10-CM | POA: Diagnosis not present

## 2021-07-17 DIAGNOSIS — E785 Hyperlipidemia, unspecified: Secondary | ICD-10-CM | POA: Diagnosis not present

## 2021-07-17 DIAGNOSIS — M9902 Segmental and somatic dysfunction of thoracic region: Secondary | ICD-10-CM | POA: Diagnosis not present

## 2021-07-17 DIAGNOSIS — E669 Obesity, unspecified: Secondary | ICD-10-CM | POA: Diagnosis not present

## 2021-07-18 ENCOUNTER — Ambulatory Visit: Payer: Medicare Other

## 2021-07-18 ENCOUNTER — Other Ambulatory Visit: Payer: Self-pay

## 2021-07-18 DIAGNOSIS — R200 Anesthesia of skin: Secondary | ICD-10-CM

## 2021-07-18 DIAGNOSIS — M79632 Pain in left forearm: Secondary | ICD-10-CM

## 2021-07-18 DIAGNOSIS — M6281 Muscle weakness (generalized): Secondary | ICD-10-CM

## 2021-07-18 DIAGNOSIS — R293 Abnormal posture: Secondary | ICD-10-CM

## 2021-07-18 DIAGNOSIS — M79631 Pain in right forearm: Secondary | ICD-10-CM

## 2021-07-18 NOTE — Therapy (Signed)
[place], [place], [address] Phone: [phone]   Fax:  [phone]  Patient Details  Name: Hannah Miller MRN: [id number] Date of Birth: 11-03-1951 Referring Provider:  Chaney Malling, [place]  Encounter Date: 07/18/2021  Patient had appointment with chiropractor yesterday who took imaging and had an abnormal finding along lumbar region and was encouraged to f/u with her PCP due to this finding. She wants to put PT on hold until she finds out what is going on with her back. All appointments were cancelled at this time and patient has plans to call back to reschedule once she has further assessment of he back.   Gwendolyn Grant, PT, DPT, ATC 07/18/21 3:41 PM   [place] [facility] [address] [place], [place], [address] Phone: [phone]   Fax:  [phone]

## 2021-07-23 ENCOUNTER — Ambulatory Visit: Payer: Medicare Other

## 2021-07-25 ENCOUNTER — Ambulatory Visit: Payer: Medicare Other

## 2021-07-30 ENCOUNTER — Ambulatory Visit: Payer: Medicare Other

## 2021-08-01 DIAGNOSIS — E1165 Type 2 diabetes mellitus with hyperglycemia: Secondary | ICD-10-CM | POA: Diagnosis not present

## 2021-08-01 DIAGNOSIS — E1169 Type 2 diabetes mellitus with other specified complication: Secondary | ICD-10-CM | POA: Diagnosis not present

## 2021-08-01 DIAGNOSIS — E785 Hyperlipidemia, unspecified: Secondary | ICD-10-CM | POA: Diagnosis not present

## 2021-08-01 DIAGNOSIS — F17211 Nicotine dependence, cigarettes, in remission: Secondary | ICD-10-CM | POA: Diagnosis not present

## 2021-08-01 DIAGNOSIS — L304 Erythema intertrigo: Secondary | ICD-10-CM | POA: Diagnosis not present

## 2021-08-01 DIAGNOSIS — Z008 Encounter for other general examination: Secondary | ICD-10-CM | POA: Diagnosis not present

## 2021-08-01 DIAGNOSIS — K8020 Calculus of gallbladder without cholecystitis without obstruction: Secondary | ICD-10-CM | POA: Diagnosis not present

## 2021-08-01 DIAGNOSIS — Z0001 Encounter for general adult medical examination with abnormal findings: Secondary | ICD-10-CM | POA: Diagnosis not present

## 2021-08-01 DIAGNOSIS — M159 Polyosteoarthritis, unspecified: Secondary | ICD-10-CM | POA: Diagnosis not present

## 2021-08-01 DIAGNOSIS — E559 Vitamin D deficiency, unspecified: Secondary | ICD-10-CM | POA: Diagnosis not present

## 2021-08-01 DIAGNOSIS — I10 Essential (primary) hypertension: Secondary | ICD-10-CM | POA: Diagnosis not present

## 2021-08-08 DIAGNOSIS — M9902 Segmental and somatic dysfunction of thoracic region: Secondary | ICD-10-CM | POA: Diagnosis not present

## 2021-08-08 DIAGNOSIS — M9901 Segmental and somatic dysfunction of cervical region: Secondary | ICD-10-CM | POA: Diagnosis not present

## 2021-08-08 DIAGNOSIS — M5136 Other intervertebral disc degeneration, lumbar region: Secondary | ICD-10-CM | POA: Diagnosis not present

## 2021-08-08 DIAGNOSIS — M9903 Segmental and somatic dysfunction of lumbar region: Secondary | ICD-10-CM | POA: Diagnosis not present

## 2021-08-08 DIAGNOSIS — M791 Myalgia, unspecified site: Secondary | ICD-10-CM | POA: Diagnosis not present

## 2021-08-08 DIAGNOSIS — M50222 Other cervical disc displacement at C5-C6 level: Secondary | ICD-10-CM | POA: Diagnosis not present

## 2021-08-08 DIAGNOSIS — R293 Abnormal posture: Secondary | ICD-10-CM | POA: Diagnosis not present

## 2021-08-08 DIAGNOSIS — M5459 Other low back pain: Secondary | ICD-10-CM | POA: Diagnosis not present

## 2021-08-08 DIAGNOSIS — M542 Cervicalgia: Secondary | ICD-10-CM | POA: Diagnosis not present

## 2021-08-08 DIAGNOSIS — M546 Pain in thoracic spine: Secondary | ICD-10-CM | POA: Diagnosis not present

## 2021-08-11 ENCOUNTER — Emergency Department (HOSPITAL_COMMUNITY): Payer: Medicare Other

## 2021-08-11 ENCOUNTER — Other Ambulatory Visit: Payer: Self-pay

## 2021-08-11 ENCOUNTER — Encounter (HOSPITAL_COMMUNITY): Payer: Self-pay | Admitting: Emergency Medicine

## 2021-08-11 ENCOUNTER — Observation Stay (HOSPITAL_COMMUNITY)
Admission: EM | Admit: 2021-08-11 | Discharge: 2021-08-12 | Disposition: A | Discharge status: Home / Self Care | Payer: Medicare Other | Attending: Internal Medicine | Admitting: Internal Medicine

## 2021-08-11 DIAGNOSIS — R519 Headache, unspecified: Secondary | ICD-10-CM | POA: Diagnosis not present

## 2021-08-11 DIAGNOSIS — G459 Transient cerebral ischemic attack, unspecified: Secondary | ICD-10-CM | POA: Diagnosis not present

## 2021-08-11 DIAGNOSIS — R29700 NIHSS score 0: Secondary | ICD-10-CM | POA: Diagnosis not present

## 2021-08-11 DIAGNOSIS — R4789 Other speech disturbances: Secondary | ICD-10-CM | POA: Diagnosis present

## 2021-08-11 DIAGNOSIS — Z87891 Personal history of nicotine dependence: Secondary | ICD-10-CM | POA: Insufficient documentation

## 2021-08-11 DIAGNOSIS — Z794 Long term (current) use of insulin: Secondary | ICD-10-CM | POA: Diagnosis not present

## 2021-08-11 DIAGNOSIS — R569 Unspecified convulsions: Secondary | ICD-10-CM | POA: Diagnosis not present

## 2021-08-11 DIAGNOSIS — Z20822 Contact with and (suspected) exposure to covid-19: Secondary | ICD-10-CM | POA: Insufficient documentation

## 2021-08-11 DIAGNOSIS — I10 Essential (primary) hypertension: Secondary | ICD-10-CM | POA: Diagnosis not present

## 2021-08-11 DIAGNOSIS — I639 Cerebral infarction, unspecified: Secondary | ICD-10-CM | POA: Diagnosis not present

## 2021-08-11 DIAGNOSIS — I63512 Cerebral infarction due to unspecified occlusion or stenosis of left middle cerebral artery: Secondary | ICD-10-CM | POA: Diagnosis not present

## 2021-08-11 DIAGNOSIS — E1165 Type 2 diabetes mellitus with hyperglycemia: Secondary | ICD-10-CM | POA: Diagnosis not present

## 2021-08-11 DIAGNOSIS — I63233 Cerebral infarction due to unspecified occlusion or stenosis of bilateral carotid arteries: Secondary | ICD-10-CM | POA: Diagnosis not present

## 2021-08-11 DIAGNOSIS — I63521 Cerebral infarction due to unspecified occlusion or stenosis of right anterior cerebral artery: Secondary | ICD-10-CM | POA: Diagnosis not present

## 2021-08-11 DIAGNOSIS — R4701 Aphasia: Secondary | ICD-10-CM | POA: Diagnosis not present

## 2021-08-11 DIAGNOSIS — I6523 Occlusion and stenosis of bilateral carotid arteries: Secondary | ICD-10-CM | POA: Diagnosis not present

## 2021-08-11 DIAGNOSIS — R9431 Abnormal electrocardiogram [ECG] [EKG]: Secondary | ICD-10-CM | POA: Diagnosis not present

## 2021-08-11 DIAGNOSIS — R2990 Unspecified symptoms and signs involving the nervous system: Secondary | ICD-10-CM

## 2021-08-11 LAB — COMPREHENSIVE METABOLIC PANEL
ALT: 19 U/L (ref 0–44)
AST: 20 U/L (ref 15–41)
Albumin: 3.7 g/dL (ref 3.5–5.0)
Alkaline Phosphatase: 64 U/L (ref 38–126)
Anion gap: 11 (ref 5–15)
BUN: 7 mg/dL — ABNORMAL LOW (ref 8–23)
CO2: 24 mmol/L (ref 22–32)
Calcium: 9.4 mg/dL (ref 8.9–10.3)
Chloride: 105 mmol/L (ref 98–111)
Creatinine, Ser: 0.88 mg/dL (ref 0.44–1.00)
GFR, Estimated: >60 mL/min (ref >60)
Glucose, Bld: 175 mg/dL — ABNORMAL HIGH (ref 70–99)
Potassium: 3.9 mmol/L (ref 3.5–5.1)
Sodium: 140 mmol/L (ref 135–145)
Total Bilirubin: 0.5 mg/dL (ref 0.3–1.2)
Total Protein: 7.5 g/dL (ref 6.5–8.1)

## 2021-08-11 LAB — DIFFERENTIAL
Abs Immature Granulocytes: 0.01 10*3/uL (ref 0.00–0.07)
Basophils Absolute: 0 10*3/uL (ref 0.0–0.1)
Basophils Relative: 0 %
Eosinophils Absolute: 0.3 10*3/uL (ref 0.0–0.5)
Eosinophils Relative: 4 %
Immature Granulocytes: 0 %
Lymphocytes Relative: 52 %
Lymphs Abs: 4 10*3/uL (ref 0.7–4.0)
Monocytes Absolute: 0.6 10*3/uL (ref 0.1–1.0)
Monocytes Relative: 7 %
Neutro Abs: 2.8 10*3/uL (ref 1.7–7.7)
Neutrophils Relative %: 37 %

## 2021-08-11 LAB — CBG MONITORING, ED
Glucose-Capillary: 173 mg/dL — ABNORMAL HIGH (ref 70–99)
Glucose-Capillary: 268 mg/dL — ABNORMAL HIGH (ref 70–99)
Glucose-Capillary: 242 mg/dL — ABNORMAL HIGH (ref 70–99)

## 2021-08-11 LAB — HIV ANTIBODY (ROUTINE TESTING W REFLEX): HIV Screen 4th Generation wRfx: NONREACTIVE

## 2021-08-11 LAB — LIPID PANEL
Cholesterol: 116 mg/dL (ref 0–200)
HDL: 34 mg/dL — ABNORMAL LOW (ref >40)
LDL Cholesterol: 61 mg/dL (ref 0–99)
Total CHOL/HDL Ratio: 3.4 RATIO
Triglycerides: 105 mg/dL (ref <150)
VLDL: 21 mg/dL (ref 0–40)

## 2021-08-11 LAB — I-STAT CHEM 8, ED
BUN: 8 mg/dL (ref 8–23)
Calcium, Ion: 1.16 mmol/L (ref 1.15–1.40)
Chloride: 105 mmol/L (ref 98–111)
Creatinine, Ser: 0.7 mg/dL (ref 0.44–1.00)
Glucose, Bld: 169 mg/dL — ABNORMAL HIGH (ref 70–99)
HCT: 37 % (ref 36.0–46.0)
Hemoglobin: 12.6 g/dL (ref 12.0–15.0)
Potassium: 4 mmol/L (ref 3.5–5.1)
Sodium: 143 mmol/L (ref 135–145)
TCO2: 27 mmol/L (ref 22–32)

## 2021-08-11 LAB — CBC
HCT: 36.8 % (ref 36.0–46.0)
Hemoglobin: 11.6 g/dL — ABNORMAL LOW (ref 12.0–15.0)
MCH: 26.2 pg (ref 26.0–34.0)
MCHC: 31.5 g/dL (ref 30.0–36.0)
MCV: 83.3 fL (ref 80.0–100.0)
Platelets: 225 10*3/uL (ref 150–400)
RBC: 4.42 MIL/uL (ref 3.87–5.11)
RDW: 13.8 % (ref 11.5–15.5)
WBC: 7.7 10*3/uL (ref 4.0–10.5)
nRBC: 0 % (ref 0.0–0.2)

## 2021-08-11 LAB — TSH: TSH: 1.067 u[IU]/mL (ref 0.350–4.500)

## 2021-08-11 LAB — RESP PANEL BY RT-PCR (FLU A&B, COVID) ARPGX2
Influenza A by PCR: NEGATIVE
Influenza B by PCR: NEGATIVE
SARS Coronavirus 2 by RT PCR: NEGATIVE

## 2021-08-11 LAB — VITAMIN B12: Vitamin B-12: 385 pg/mL (ref 180–914)

## 2021-08-11 LAB — APTT: aPTT: 26 seconds (ref 24–36)

## 2021-08-11 LAB — PROTIME-INR
INR: 1 (ref 0.8–1.2)
Prothrombin Time: 13.6 seconds (ref 11.4–15.2)

## 2021-08-11 MED ORDER — ATORVASTATIN CALCIUM 40 MG PO TABS
40.0000 mg | ORAL_TABLET | Freq: Every day | ORAL | Status: DC
  Administered 2021-08-11 – 2021-08-12 (×2): 40 mg via ORAL
  Filled 2021-08-11 (×2): qty 1

## 2021-08-11 MED ORDER — CLOPIDOGREL BISULFATE 75 MG PO TABS: 75.0000 mg | ORAL_TABLET | Freq: Every day | ORAL | Status: DC

## 2021-08-11 MED ORDER — CLOPIDOGREL BISULFATE 75 MG PO TABS
75.0000 mg | ORAL_TABLET | Freq: Every day | ORAL | Status: DC
  Administered 2021-08-11: 75 mg via ORAL
  Filled 2021-08-11: qty 1

## 2021-08-11 MED ORDER — ASPIRIN 325 MG PO TABS
325.0000 mg | ORAL_TABLET | Freq: Every day | ORAL | Status: DC
  Administered 2021-08-11: 325 mg via ORAL
  Filled 2021-08-11: qty 1

## 2021-08-11 MED ORDER — RIVAROXABAN 10 MG PO TABS
10.0000 mg | ORAL_TABLET | Freq: Every day | ORAL | Status: DC
  Administered 2021-08-11: 10 mg via ORAL
  Filled 2021-08-11 (×2): qty 1

## 2021-08-11 MED ORDER — SODIUM CHLORIDE 0.9% FLUSH
3.0000 mL | Freq: Two times a day (BID) | INTRAVENOUS | Status: DC
  Administered 2021-08-11 – 2021-08-12 (×2): 3 mL via INTRAVENOUS

## 2021-08-11 MED ORDER — SODIUM CHLORIDE 0.9% FLUSH
3.0000 mL | Freq: Once | INTRAVENOUS | Status: DC
Start: 2021-08-11 — End: 2021-08-13

## 2021-08-11 MED ORDER — ASPIRIN EC 81 MG PO TBEC: 81.0000 mg | DELAYED_RELEASE_TABLET | Freq: Every day | ORAL | Status: DC

## 2021-08-11 MED ORDER — INSULIN GLARGINE-YFGN 100 UNIT/ML ~~LOC~~ SOLN
10.0000 [IU] | Freq: Every day | SUBCUTANEOUS | Status: DC
  Administered 2021-08-11: 10 [IU] via SUBCUTANEOUS
  Filled 2021-08-11 (×2): qty 0.1

## 2021-08-11 MED ORDER — INSULIN ASPART 100 UNIT/ML IJ SOLN
0.0000 [IU] | Freq: Three times a day (TID) | INTRAMUSCULAR | Status: DC
  Administered 2021-08-11: 8 [IU] via SUBCUTANEOUS
  Administered 2021-08-12: 15 [IU] via SUBCUTANEOUS
  Administered 2021-08-12 (×2): 8 [IU] via SUBCUTANEOUS

## 2021-08-11 NOTE — ED Notes (Signed)
Please notify daughter, [name] when pt moves from ED. Number in chart.

## 2021-08-11 NOTE — Evaluation (Signed)
Physical Therapy Evaluation Only Patient Details Name: Hannah Miller MRN: [id number] DOB: Nov 18, 1951 Today's Date: 08/11/2021  History of Present Illness  Pt is a 70 y.o. female who presented 08/11/21 with concern for episode of stuttering/aphasia, which the pt does not recall, per chart. On arrival to ED, pt noted to have left leg drift. No tPA was administered. CT head negative. Incomplete head MRI, but no acute intracranial abnormality found.  Imaging revealed intracranial atherosclerosis including moderate to severe  supraclinoid ICA stenoses, severe stenosis or short segment occlusion of the proximal left M2 superior division, and a severe right A1 stenosis. Work-up for TIA vs seizure. PMH: DM   Clinical Impression  Pt presents with condition above. PTA, she was independent and lived alone in a 1-level house with 5 STE with bil rails. Pt appears to be at her baseline cognitively and functionally currently. She displays symmetrical and intact bil UE and lower extremity strength, coordination, and sensation with a hx of neuropathy in her hands. She is able to perform all functional mobility without assistance, support, or LOB at this time. No PT or OT needs identified. All education completed and questions answered. PT will sign off.     Recommendations for follow up therapy are one component of a multi-disciplinary discharge planning process, led by the attending physician.  Recommendations may be updated based on patient status, additional functional criteria and insurance authorization.  Follow Up Recommendations No PT follow up    Equipment Recommendations  None recommended by PT    Recommendations for Other Services       Precautions / Restrictions Precautions Precautions: None Restrictions Weight Bearing Restrictions: No      Mobility  Bed Mobility Overal bed mobility: Modified Independent             General bed mobility comments: Pt able to transition supine > sit edge  of stretcher with HOB elevated without assistance.    Transfers Overall transfer level: Independent Equipment used: None Transfers: Sit to/from Stand Sit to Stand: Independent         General transfer comment: Pt able to come to stand from edge of stretcher without assistance or LOB.  Ambulation/Gait Ambulation/Gait assistance: Supervision Gait Distance (Feet): 175 Feet Assistive device: None Gait Pattern/deviations: WFL(Within Functional Limits) Gait velocity: WNL Gait velocity interpretation: >4.37 ft/sec, indicative of normal walking speed General Gait Details: Pt initially ambulating slowly, but began to increase speed back to her reported baseline as distance progressed. Pt able to change speeds, directions, and head positions without unsteadiness or LOB. Supervision for safety, but could be independent.  Stairs            Wheelchair Mobility    Modified Rankin (Stroke Patients Only) Modified Rankin (Stroke Patients Only) Pre-Morbid Rankin Score: No symptoms Modified Rankin: No symptoms     Balance Overall balance assessment: No apparent balance deficits (not formally assessed)                                           Pertinent Vitals/Pain Pain Assessment: No/denies pain    Home Living Family/patient expects to be discharged to:: Private residence Living Arrangements: Alone Available Help at Discharge: Family;Available 24 hours/day Type of Home: House Home Access: Stairs to enter Entrance Stairs-Rails: Can reach both Entrance Stairs-Number of Steps: 5 Home Layout: One level Home Equipment: Shower seat - built in  Prior Function Level of Independence: Independent         Comments: Drives. 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     Hand Dominance   Dominant Hand: Right    Extremity/Trunk Assessment   Upper Extremity Assessment Upper Extremity Assessment: Overall WFL for tasks assessed (Symmetrical strength  with MMT scores of 4+ shoulder flexion, 5 elbow flexion, 5 elbow extension bil; slightly decreased strength in L hand intrinsic muscles but she is R-handed; coordination intact bil; hx of neuropathy, no acute sensation changes)    Lower Extremity Assessment Lower Extremity Assessment: Overall WFL for tasks assessed (Symmetrical strength with MMT scores of 4+ hip flexion, 5 knee extension, 4 knee flexion, 5 ankle dorsiflexion bil; denied numbness/tingling bil; coordination intact bil)    Cervical / Trunk Assessment Cervical / Trunk Assessment: Normal  Communication   Communication: No difficulties  Cognition Arousal/Alertness: Awake/alert Behavior During Therapy: WFL for tasks assessed/performed Overall Cognitive Status: Within Functional Limits for tasks assessed                                 General Comments: Pt and family member report pt is back to baseline cognitively. Pt appropriately following multi-step commands safely without difficulty.      General Comments General comments (skin integrity, edema, etc.): VSS on RA    Exercises     Assessment/Plan    PT Assessment Patent does not need any further PT services  PT Problem List         PT Treatment Interventions      PT Goals (Current goals can be found in the Care Plan section)  Acute Rehab PT Goals Patient Stated Goal: to get a neuro consult for her neck and back chronic pain PT Goal Formulation: All assessment and education complete, DC therapy Time For Goal Achievement: 08/12/21 Potential to Achieve Goals: Good    Frequency     Barriers to discharge        Co-evaluation               AM-PAC PT "6 Clicks" Mobility  Outcome Measure Help needed turning from your back to your side while in a flat bed without using bedrails?: None Help needed moving from lying on your back to sitting on the side of a flat bed without using bedrails?: None Help needed moving to and from a bed to a chair  (including a wheelchair)?: None Help needed standing up from a chair using your arms (e.g., wheelchair or bedside chair)?: None Help needed to walk in hospital room?: A Little Help needed climbing 3-5 steps with a railing? : A Little 6 Click Score: 22    End of Session Equipment Utilized During Treatment: Gait belt Activity Tolerance: Patient tolerated treatment well Patient left: in bed;with call bell/phone within reach;with family/visitor present Nurse Communication: Mobility status PT Visit Diagnosis: Other symptoms and signs involving the nervous system (R29.898)    Time: [id number] PT Time Calculation (min) (ACUTE ONLY): 24 min   Charges:   PT Evaluation $PT Eval Low Complexity: 1 Low PT Treatments $Gait Training: 8-22 mins        Moishe Spice, PT, DPT Acute Rehabilitation Services  Pager: [phone] Office: [place] 08/11/2021, 3:44 PM

## 2021-08-11 NOTE — ED Notes (Signed)
Patient transported to MRI 

## 2021-08-11 NOTE — ED Notes (Signed)
Pt returned from MRI °

## 2021-08-11 NOTE — Progress Notes (Signed)
EEG complete - results pending 

## 2021-08-11 NOTE — ED Notes (Signed)
Received pt, ao x 4, NAD. Denies any complaints. Neuro status back to baseline per pt and daughter at the bedside.

## 2021-08-11 NOTE — Hospital Course (Signed)
TIA - MRI brain without contrast - Ordered. - Recommend vascular imaging with MRA head and neck - Ordered. - Routine EEG to eval for any epileptogenic discharges - Ordered. - Recommend metabolic/infectious workup if deemed necessary.  - Continue aspirin '81mg'$  daily. - Clopidogrel '75mg'$  daily for 3 weeks. - SBP<180 for now. - SBP goal <130/90 for diabetic patients. - Telemetry monitoring for arrhythmia.   On discharge she will need Plavix 75 mg for 90 days and aspirin 325 mg.

## 2021-08-11 NOTE — H&P (Signed)
Date: 08/11/2021               Patient Name:  Hannah Miller MRN: [id number]  DOB: 04-02-1951 Age / Sex: 70 y.o., female   PCP: Pcp, No         Medical Service: Internal Medicine Teaching Service         Attending Physician: Dr. Aldine Contes, MD    First Contact: Dr. Christiana Fuchs Pager: [phone]  Second Contact: Dr. Sanjuan Dame Pager: [phone]       After Hours (After 5p/  First Contact Pager: [phone]  weekends / holidays): Second Contact Pager: [phone]   Chief Complaint: altered mental status  History of Present Illness: Hannah Miller is a 70 y.o. with past medical history of Insulin-dependent Diabetes Mellitus, Hyperlipidemia, and Hypertension presented following an episode of altered mental status.  On initial evaluation, patient deferred most questions to daughter.  Her daughter stated that this morning Hannah Miller was talking with her siblings and making coffee.  She walked back to the kitchen table and sat down.  Then she began to lean to the left in her chair, grip the table, and had difficulty producing speech.  Her daughter called 911 at that time and patient was unable to follow prompts relayed by daughter including, to smile and say, "Does the early bird catch the worm."  Daughter states that this episode lasted about 5 minutes and stared blankly back at her.  No limb movements were noted throughout episode and patient did not lose continence.  Patient was unable to recall what happened this morning, but states that she just remembers EMS coming in.    ED course: On evaluation in the emergency department she was reported to have a slight flattening of her left nasolabial fold and left lower extremity drift.  CT head showed no acute intracranial hemorrhage or infarct.  ASPECTS [facility] Stroke Program Early CT Score)= 10 which is normal.  Patient denies concerns of fever, chills, headache, shortness of breath, chest pain, and nausea.  She does state that she has  paresthesias in her hands bilaterally that have been there since July 2022.   Meds:  Current Meds  Medication Sig   aspirin 81 MG tablet Take 81 mg by mouth daily.   atorvastatin (LIPITOR) 40 MG tablet Take 40 mg by mouth daily.   cholecalciferol (VITAMIN D3) 25 MCG (1000 UNIT) tablet Take 1,000 Units by mouth daily.   insulin lispro protamine-insulin lispro (HUMALOG 75/25) (75-25) 100 UNIT/ML SUSP Inject 50 Units into the skin in the morning, at noon, and at bedtime.   lisinopril (PRINIVIL,ZESTRIL) 5 MG tablet Take 5 mg by mouth daily.     Allergies: Allergies as of 08/11/2021   (No Known Allergies)   Past Medical History:  Diagnosis Date   Diabetes mellitus without complication ([place])     Family History: No family history of seizures or strokes per daughter.  Social History: Patient lives by herself in a [place] apartment.  Her elderly mother and one of her cousins live in [place]. She has 2 daughters, 1 that lives in [place] and the other lives in [place] ([facility]).  Her extended family including patient's siblings were in town this weekend due to patient's mother's declining health.  Patient states that she is largely vegetarian and eats minimal meat.  Review of Systems: A complete ROS was negative except as per HPI.   Physical Exam: Blood pressure (!) 152/61, pulse 78, temperature 98.8 F (37.1 C),  temperature source Oral, resp. rate 18, height '5\' 6"'$  (1.676 m), weight 91.6 kg, SpO2 100 %. General: Well-developed, well-nourished, alert, oriented to person, place, and time HENT:  Eyes: Pupils are equal round and reactive to light  CV: no murmurs, rubs, or gallops, normal rate Pulm: CTAB, normal pulmonary effort GI: no tenderness, bowel sounds present MSK: normal muscle and tone,  no pitting edema in legs bilaterally Neuro: Speech fluent, no signs of aphasia, muscle strength 5/5 in ankle flexion and extension and 5/5 in elbow flexion and extension,  Cranial  Nerves: II: visual acuity normal bilaterally,  II: visual field normal,  III,VII: ptosis absent,  III,IV,VI: extraocular muscles extra-ocular motions intact, V: mastication normal,  V: facial light touch sensation normal bilaterally, VII: facial muscle function - upper normal bilaterally,  VII: facial muscle function - lower equivocal bilaterally,  IX: soft palate elevation normal bilaterally,  XI: trapezius strength normal bilaterally,  XI: sternocleidomastoid strength normal bilaterally,  Skin: warm and dry Psych: tearful at times, answers questions appropriately, but does not recall episode from this morning  EKG: personally reviewed my interpretation is sinus rhythm  MR angio head/ neck wo contrast 09/18: IMPRESSION: 1. Incomplete head MRI.  No acute intracranial abnormality. 2. Mild chronic small vessel ischemic disease. 3. Intracranial atherosclerosis including moderate to severe supraclinoid ICA stenoses, severe stenosis or short segment occlusion of the proximal left M2 superior division, and a severe right A1 stenosis. 4. Negative neck MRA.   Assessment & Plan by Problem: Active Problems:   Transient ischemic attack (TIA)  Hannah Miller is a 70 y.o. with past medical history of Insulin-dependent Diabetes Mellitus, Hyperlipidemia, and Hypertension presented following an episode of altered mental status and was admitted due to Transient Ischemic Attack.   #Transient Ischemic Attack Patient presented due to 2 episode of altered mental status.  On initial evaluation in the ED, patient was at times tearful and largely deferred to her daughter.  We reevaluated her about 2 hours later and she was more talkative and at her baseline per her daughter.  On exam patient did not have residual neurological deficits.  MRI of her head and neck showed intracranial atherosclerosis including moderate to severe supraclinoid ICA stenosis, and occlusion of the proximal left M2 superior  division, and a severe right A1 stenosis.   EEG was also performed due to concern for possible seizure.  Altered mental status most likely due to a transient ischemic attack.  Patient has significant plaque present on MRI head/neck.  Lipid panel showed LDL of 61.  -No PT follow-up recommended -Continue aspirin 325 mg daily -Start Plavix 75 mg -Follow-up on EEG -Telemetry monitoring   #Paresthesias, bilaterally Presented with concerns over paresthesias in her hands bilaterally.  These have been present since July.  Vitamin B12 within normal limits.  Currently follows with physical therapy due to this.  Differentials for this include cervical radiculopathy versus carpal tunnel syndrome.  She states that she wears braces at her hands at night and this has helped.  Unlikely that this is related to her episode today considering it started in July. -Vitamin B12 within normal limits -TSH within normal limits  #Insulin-dependent diabetes mellitus, type 2 At home patient is on 50 units of Humalog.  -CBG monitoring -Initiated glargine 10 units -SSI -Follow-up on A1c  #Hypertension Lisinopril 5 mg at home.  She has been hypotensive in the ED today with BP in the 160s/ 100s.   -Allow for permissive hypertension  #Normocytic anemia Hemoglobin 11.6,  MCV of 83.3.  Dispo: Admit patient to Observation with expected length of stay less than 2 midnights.  Signed: Christiana Fuchs, DO 08/11/2021, 6:44 PM  Pager: [phone] After 5pm on weekdays and 1pm on weekends: On Call pager: [phone]

## 2021-08-11 NOTE — Procedures (Signed)
Routine EEG Report  Hannah Miller is a 70 y.o. female with a history of spells who is undergoing an EEG to evaluate for seizures.  Report: This EEG was acquired with electrodes placed according to the International 10-20 electrode system (including Fp1, Fp2, F3, F4, C3, C4, P3, P4, O1, O2, T3, T4, T5, T6, A1, A2, Fz, Cz, Pz). The following electrodes were missing or displaced: none.  The occipital dominant rhythm was 9-10 Hz. This activity is reactive to stimulation. Drowsiness was manifested by background fragmentation; deeper stages of sleep were not identified. There was no focal slowing. There were no interictal epileptiform discharges. There were no electrographic seizures identified. Photic stimulation and hyperventilation were not performed.  Impression: This EEG was obtained while awake and drowsy and is normal.    Clinical Correlation: Normal EEGs, however, do not rule out epilepsy.  Su Monks, MD [facility] [phone]  If 7pm- 7am, please page neurology on call as listed in [place].

## 2021-08-11 NOTE — Consult Note (Addendum)
Neurology Consult H&P  Patches Montecino MR# [id number] 08/11/2021   CC: family witnessed 5 min episode of stuttering, aphasia  History is obtained from: patient, EMS and chart  HPI: Hannah Miller is a 70 y.o. female PMHx as reviewed below, HTN, HLD  was helping her younger brother make coffee and suddenly started to stuttering with behavioral arrest which lasted about 5 minutes. She then only remembers being surrounded by EMS and wondering why there were so many men around her. EMS reported word finding difficulty. On evaluation in ED she purportedly had slight flattening of left nasolabial fold and LLE drift.  *Note the patient has chronic back pain.  Denies f/c/n/v/cp/sob/d/b&b changes.  LKW: 0800 tNK given: No low IR Thrombectomy No Modified Rankin Scale: 0-Completely asymptomatic and back to baseline post- stroke NIHSS: 0  ROS: A complete ROS was performed and is negative except as noted in the HPI.   Past Medical History:  Diagnosis Date   Diabetes mellitus without complication ([place])    Family History  Problem Relation Age of Onset   Healthy Mother    Healthy Father     Social History:  reports that she has quit smoking. Her smoking use included cigarettes. She has never used smokeless tobacco. She reports that she does not currently use alcohol. She reports that she does not use drugs.   Prior to Admission medications   Medication Sig Start Date End Date Taking? Authorizing Provider  aspirin 81 MG tablet Take 81 mg by mouth daily.    [provider]  insulin lispro protamine-insulin lispro (HUMALOG 75/25) (75-25) 100 UNIT/ML SUSP Inject into the skin.    [provider]  lisinopril (PRINIVIL,ZESTRIL) 5 MG tablet Take 5 mg by mouth daily.    [provider]  meloxicam (MOBIC) 15 MG tablet Take 1 tablet (15 mg total) by mouth daily. 07/30/20   Tasia Catchings, Amy V, PA-C  pravastatin (PRAVACHOL) 10 MG tablet Take 10 mg by mouth daily.    [provider]  tiZANidine (ZANAFLEX) 2 MG tablet Take 1 tablet (2 mg total) by mouth every 8 (eight) hours as needed for muscle spasms. 07/30/20   Tasia Catchings, Amy V, PA-C  Vitamin D, Ergocalciferol, (DRISDOL) 50000 UNITS CAPS Take 50,000 Units by mouth.    [provider]  metFORMIN (GLUCOPHAGE) 1000 MG tablet Take 1,000 mg by mouth 2 (two) times daily with a meal.  07/30/20  [provider]   Exam: Current vital signs: BP (!) 163/72 (BP Location: Right Arm)   Pulse 80   Temp 98.8 F (37.1 C) (Oral)   Resp 16   Ht '5\' 6"'$  (1.676 m)   Wt 91.6 kg   SpO2 100%   BMI 32.60 kg/m   Physical Exam  Constitutional: Appears well-developed and well-nourished.  Psych: Affect appropriate to situation Eyes: No scleral injection HENT: No OP obstruction. Head: Normocephalic.  Cardiovascular: Normal rate and regular rhythm.  Respiratory: Effort normal, symmetric excursions bilaterally, no audible wheezing. GI: Soft.  No distension. There is no tenderness.  Skin: WDI  Neuro: Mental Status: Patient is awake, alert, oriented to person, place, month, year, and situation. Patient is able to give a clear and coherent history. Speech fluent, intact comprehension and repetition. No signs of aphasia or neglect. Visual Fields are full. Pupils are equal, round, and reactive to light. EOMI without ptosis or diploplia.  Facial sensation is symmetric to temperature Facial movement is symmetric.  Hearing is intact to voice. Uvula midline and palate elevates  symmetrically. Shoulder shrug is symmetric. Tongue is midline without atrophy or fasciculations.  Tone is normal. Bulk is normal. 5/5 strength was present in all four extremities. Sensation is symmetric to light touch and temperature in the arms and legs. Deep Tendon Reflexes: 2+ and symmetric in the biceps and patellae. Toes are downgoing bilaterally. FNF and HKS are intact bilaterally. Gait - Deferred  I have reviewed labs in epic and the pertinent  results are: BG 169  I have reviewed the images obtained: NCT head showed No acute intracranial hemorrhage or infarct, ASPECTS 10  Assessment: Hannah Miller is a 70 y.o. female PMHx HTN, HLD, DM2 had ~5 minute episode of behavioral arrest with word finding difficulty and she has no recollection of this event. The event is more suggestive of seizure and will pursue EEG and MRI brain.  Though she has vascular risk factors, she mentioned that she has close follow up with her PCP who started a statin and is monitoring her BP and we will also pursue MRA head and neck. If imaging is positive will complete stroke workup.  Recommended aspirin '324mg'$  now.  Impression:  Seizure ~5 minutes of behavioral arrest without recollection. TIA NIHSS 0 No labs available at the time of evaluation.  Plan: - MRI brain without contrast - Ordered. - Recommend vascular imaging with MRA head and neck - Ordered. - Routine EEG to eval for any epileptogenic discharges - Ordered. - Recommend metabolic/infectious workup if deemed necessary.  - Continue aspirin '81mg'$  daily. - Clopidogrel '75mg'$  daily for 3 weeks. - SBP<180 for now. - SBP goal <130/90 for diabetic patients. - Telemetry monitoring for arrhythmia.   This patient is critically ill and at significant risk of neurological worsening, death and care requires constant monitoring of vital signs, hemodynamics,respiratory and cardiac monitoring, neurological assessment, discussion with family, other specialists and medical decision making of high complexity. I spent 75 minutes of neurocritical care time  in the care of  this patient. This was time spent independent of any time provided by nurse practitioner or PA.  Electronically signed by:  Lynnae Sandhoff, MD Page: [id number] 08/11/2021, 10:25 AM

## 2021-08-11 NOTE — ED Triage Notes (Signed)
Pt arrives via [facility] from home where family witnessed 5 min episode of stuttering, aphasia, which pt doesn't remember.  EMS reports some difficulty word finding en route. On arrival pt AOx4, slight L labial fold flattened, drift in LLE, speech fluent.  LSN 0800 per EMS.

## 2021-08-11 NOTE — ED Provider Notes (Signed)
[place] EMERGENCY DEPARTMENT Provider Note   CSN: [id number] Arrival date & time: 08/11/21  [id number]  An emergency department physician performed an initial assessment on this suspected stroke patient at 0958.  History Chief Complaint  Patient presents with   Code Stroke    LSN [id number]    Tinashe Hoss is a 70 y.o. female.  Past medical history of diabetes presents to ER with concern for episode of stuttering, aphasia.  Code stroke initiated in triage due to concern for drift in left lower leg.  Patient does not recall episode.  She has no complaints at present.  Do not feel confused and does not feel like her speech is different than normal.  Does not have any weakness or numbness in her extremities.  Episode reportedly occurred at 8 AM and patient was reportedly normal prior to this episode.  HPI     Past Medical History:  Diagnosis Date   Diabetes mellitus without complication [facility])     Patient Active Problem List   Diagnosis Date Noted   IDDM (insulin dependent diabetes mellitus) 07/27/2012   BMI 33.0-33.9,adult 07/27/2012    History reviewed. No pertinent surgical history.   OB History   No obstetric history on file.     Family History  Problem Relation Age of Onset   Healthy Mother    Healthy Father     Social History   Tobacco Use   Smoking status: Former    Types: Cigarettes   Smokeless tobacco: Never  Substance Use Topics   Alcohol use: Not Currently   Drug use: Never    Home Medications Prior to Admission medications   Medication Sig Start Date End Date Taking? Authorizing Provider  aspirin 81 MG tablet Take 81 mg by mouth daily.    [provider]  insulin lispro protamine-insulin lispro (HUMALOG 75/25) (75-25) 100 UNIT/ML SUSP Inject into the skin.    [provider]  lisinopril (PRINIVIL,ZESTRIL) 5 MG tablet Take 5 mg by mouth daily.    [provider]  meloxicam (MOBIC) 15 MG tablet Take 1 tablet  (15 mg total) by mouth daily. 07/30/20   Tasia Catchings, Amy V, PA-C  pravastatin (PRAVACHOL) 10 MG tablet Take 10 mg by mouth daily.    [provider]  tiZANidine (ZANAFLEX) 2 MG tablet Take 1 tablet (2 mg total) by mouth every 8 (eight) hours as needed for muscle spasms. 07/30/20   Tasia Catchings, Amy V, PA-C  Vitamin D, Ergocalciferol, (DRISDOL) 50000 UNITS CAPS Take 50,000 Units by mouth.    [provider]  metFORMIN (GLUCOPHAGE) 1000 MG tablet Take 1,000 mg by mouth 2 (two) times daily with a meal.  07/30/20  [provider]    Allergies    Patient has no known allergies.  Review of Systems   Review of Systems  Constitutional:  Negative for chills and fever.  HENT:  Negative for ear pain and sore throat.   Eyes:  Negative for pain and visual disturbance.  Respiratory:  Negative for cough and shortness of breath.   Cardiovascular:  Negative for chest pain and palpitations.  Gastrointestinal:  Negative for abdominal pain and vomiting.  Genitourinary:  Negative for dysuria and hematuria.  Musculoskeletal:  Negative for arthralgias and back pain.  Skin:  Negative for color change and rash.  Neurological:  Positive for facial asymmetry and weakness. Negative for seizures and syncope.  Psychiatric/Behavioral:  Positive for confusion.   All other systems reviewed and are negative.  Physical  Exam Updated Vital Signs BP (!) 178/110   Pulse 76   Temp 98.8 F (37.1 C) (Oral)   Resp 17   Ht '5\' 6"'$  (1.676 m)   Wt 91.6 kg   SpO2 100%   BMI 32.60 kg/m   Physical Exam Vitals and nursing note reviewed.  Constitutional:      General: She is not in acute distress.    Appearance: She is well-developed.  HENT:     Head: Normocephalic and atraumatic.  Eyes:     Conjunctiva/sclera: Conjunctivae normal.  Cardiovascular:     Rate and Rhythm: Normal rate and regular rhythm.     Heart sounds: No murmur heard. Pulmonary:     Effort: Pulmonary effort is normal. No respiratory distress.      Breath sounds: Normal breath sounds.  Abdominal:     Palpations: Abdomen is soft.     Tenderness: There is no abdominal tenderness.  Musculoskeletal:     Cervical back: Neck supple.  Skin:    General: Skin is warm and dry.  Neurological:     Mental Status: She is alert.     Comments: AAOx3 CN 2-12 intact, speech clear visual fields intact 5/5 strength in b/l UE and LE Sensation to light touch intact in b/l UE and LE Normal FNF    ED Results / Procedures / Treatments   Labs (all labs ordered are listed, but only abnormal results are displayed) Labs Reviewed  CBC - Abnormal; Notable for the following components:      Result Value   Hemoglobin 11.6 (*)    All other components within normal limits  COMPREHENSIVE METABOLIC PANEL - Abnormal; Notable for the following components:   Glucose, Bld 175 (*)    BUN 7 (*)    All other components within normal limits  I-STAT CHEM 8, ED - Abnormal; Notable for the following components:   Glucose, Bld 169 (*)    All other components within normal limits  CBG MONITORING, ED - Abnormal; Notable for the following components:   Glucose-Capillary 173 (*)    All other components within normal limits  RESP PANEL BY RT-PCR (FLU A&B, COVID) [id number]  PROTIME-INR  APTT  DIFFERENTIAL    EKG None  Radiology CT HEAD CODE STROKE WO CONTRAST  Result Date: 08/11/2021 CLINICAL DATA:  Code stroke.  Left lower extremity weakness EXAM: CT HEAD WITHOUT CONTRAST TECHNIQUE: Contiguous axial images were obtained from the base of the skull through the vertex without intravenous contrast. COMPARISON:  None. FINDINGS: Brain: There is no acute intracranial hemorrhage, extra-axial fluid collection, or acute infarct. The ventricles are not enlarged. There is no mass lesion. There is no midline shift. Vascular: There is calcification of the bilateral cavernous ICAs. No hyperdense vessel is seen. Skull: Normal. Negative for fracture or focal lesion. Sinuses/Orbits: No  acute finding. Other: None. ASPECTS [facility] Stroke Program Early CT Score) - Ganglionic level infarction (caudate, lentiform nuclei, internal capsule, insula, M1-M3 cortex): 7 - Supraganglionic infarction (M4-M6 cortex): 3 Total score (0-10 with 10 being normal): 10 IMPRESSION: 1. No acute intracranial hemorrhage or infarct. 2. ASPECTS is 10 Electronically Signed   By: Valetta Mole M.D.   On: 08/11/2021 10:23    Procedures Procedures   Medications Ordered in ED Medications  sodium chloride flush (NS) 0.9 % injection 3 mL (3 mLs Intravenous Not Given 08/11/21 1201)    ED Course  I have reviewed the triage vital signs and the nursing notes.  Pertinent labs & imaging  results that were available during my care of the patient were reviewed by me and considered in my medical decision making (see chart for details).    MDM Rules/Calculators/A&P                           70 year old lady presents to ER with concern for episode of stuttering/aphasia.  On arrival to ER, on initial assessment noted patient had left leg drift, code stroke initiated due to concern for possible persistent neurologic deficit.  CT head negative.  Neurology evaluated.  No true focal weakness however on more detailed exam.  Felt symptoms concerning for possible seizure given patient does not recall the episode versus TIA.  Recommend stroke work-up, EEG.  Discussed case with internal medicine teaching service who will come evaluate and admit patient for further management.  Not tPA candidate given resolving symptoms, stronger suspicion for seizure versus TIA.   Final Clinical Impression(s) / ED Diagnoses Final diagnoses:  TIA (transient ischemic attack)  Seizure-like activity ([place])  Stroke-like symptom    Rx / DC Orders ED Discharge Orders     None        Lucrezia Starch, MD 08/11/21 1206

## 2021-08-12 ENCOUNTER — Observation Stay (HOSPITAL_BASED_OUTPATIENT_CLINIC_OR_DEPARTMENT_OTHER): Payer: Medicare Other

## 2021-08-12 DIAGNOSIS — E7800 Pure hypercholesterolemia, unspecified: Secondary | ICD-10-CM | POA: Diagnosis not present

## 2021-08-12 DIAGNOSIS — R569 Unspecified convulsions: Secondary | ICD-10-CM | POA: Diagnosis not present

## 2021-08-12 DIAGNOSIS — G459 Transient cerebral ischemic attack, unspecified: Secondary | ICD-10-CM | POA: Diagnosis not present

## 2021-08-12 DIAGNOSIS — I10 Essential (primary) hypertension: Secondary | ICD-10-CM | POA: Diagnosis not present

## 2021-08-12 DIAGNOSIS — E1165 Type 2 diabetes mellitus with hyperglycemia: Secondary | ICD-10-CM

## 2021-08-12 DIAGNOSIS — R2990 Unspecified symptoms and signs involving the nervous system: Secondary | ICD-10-CM | POA: Diagnosis not present

## 2021-08-12 LAB — ECHOCARDIOGRAM COMPLETE
AR max vel: 1.88 cm2
AV Area VTI: 1.86 cm2
AV Area mean vel: 1.82 cm2
AV Mean grad: 6 mmHg
AV Peak grad: 10 mmHg
Ao pk vel: 1.58 m/s
Area-P 1/2: 3.77 cm2
Height: 66 in
S' Lateral: 3.2 cm
Weight: 3232 [oz_av]

## 2021-08-12 LAB — BASIC METABOLIC PANEL
Anion gap: 9 (ref 5–15)
BUN: 8 mg/dL (ref 8–23)
CO2: 26 mmol/L (ref 22–32)
Calcium: 8.7 mg/dL — ABNORMAL LOW (ref 8.9–10.3)
Chloride: 101 mmol/L (ref 98–111)
Creatinine, Ser: 0.87 mg/dL (ref 0.44–1.00)
GFR, Estimated: >60 mL/min (ref >60)
Glucose, Bld: 215 mg/dL — ABNORMAL HIGH (ref 70–99)
Potassium: 3.8 mmol/L (ref 3.5–5.1)
Sodium: 136 mmol/L (ref 135–145)

## 2021-08-12 LAB — VITAMIN B12: Vitamin B-12: 358 pg/mL (ref 180–914)

## 2021-08-12 LAB — CBC
HCT: 33.8 % — ABNORMAL LOW (ref 36.0–46.0)
Hemoglobin: 10.6 g/dL — ABNORMAL LOW (ref 12.0–15.0)
MCH: 26.1 pg (ref 26.0–34.0)
MCHC: 31.4 g/dL (ref 30.0–36.0)
MCV: 83.3 fL (ref 80.0–100.0)
Platelets: 192 10*3/uL (ref 150–400)
RBC: 4.06 MIL/uL (ref 3.87–5.11)
RDW: 13.7 % (ref 11.5–15.5)
WBC: 6.3 10*3/uL (ref 4.0–10.5)
nRBC: 0 % (ref 0.0–0.2)

## 2021-08-12 LAB — RAPID URINE DRUG SCREEN, HOSP PERFORMED
Amphetamines: NOT DETECTED
Barbiturates: NOT DETECTED
Benzodiazepines: NOT DETECTED
Cocaine: NOT DETECTED
Opiates: NOT DETECTED
Tetrahydrocannabinol: NOT DETECTED

## 2021-08-12 LAB — HIV ANTIBODY (ROUTINE TESTING W REFLEX): HIV Screen 4th Generation wRfx: NONREACTIVE

## 2021-08-12 LAB — FERRITIN: Ferritin: 173 ng/mL (ref 11–307)

## 2021-08-12 LAB — HEMOGLOBIN A1C
Hgb A1c MFr Bld: 10.3 % — ABNORMAL HIGH (ref 4.8–5.6)
Mean Plasma Glucose: 248.91 mg/dL

## 2021-08-12 LAB — IRON AND TIBC
Iron: 78 ug/dL (ref 28–170)
Saturation Ratios: 24 % (ref 10.4–31.8)
TIBC: 329 ug/dL (ref 250–450)
UIBC: 251 ug/dL

## 2021-08-12 LAB — CBG MONITORING, ED
Glucose-Capillary: 355 mg/dL — ABNORMAL HIGH (ref 70–99)
Glucose-Capillary: 279 mg/dL — ABNORMAL HIGH (ref 70–99)
Glucose-Capillary: 284 mg/dL — ABNORMAL HIGH (ref 70–99)

## 2021-08-12 LAB — TSH: TSH: 2.117 u[IU]/mL (ref 0.350–4.500)

## 2021-08-12 MED ORDER — ASPIRIN 325 MG PO TBEC: 325.0000 mg | DELAYED_RELEASE_TABLET | Freq: Every day | ORAL | 0 refills | Status: AC

## 2021-08-12 MED ORDER — LISINOPRIL 10 MG PO TABS: 5.0000 mg | ORAL_TABLET | Freq: Every day | ORAL | Status: DC

## 2021-08-12 MED ORDER — POLYETHYLENE GLYCOL 3350 17 G PO PACK
17.0000 g | PACK | Freq: Every day | ORAL | Status: DC | PRN
  Administered 2021-08-12: 17 g via ORAL
  Filled 2021-08-12: qty 1

## 2021-08-12 MED ORDER — ENOXAPARIN SODIUM 40 MG/0.4ML IJ SOSY
40.0000 mg | PREFILLED_SYRINGE | INTRAMUSCULAR | Status: DC
  Filled 2021-08-12: qty 0.4

## 2021-08-12 MED ORDER — INSULIN ASPART 100 UNIT/ML IJ SOLN
6.0000 [IU] | Freq: Three times a day (TID) | INTRAMUSCULAR | Status: DC
  Administered 2021-08-12: 6 [IU] via SUBCUTANEOUS

## 2021-08-12 MED ORDER — INSULIN GLARGINE-YFGN 100 UNIT/ML ~~LOC~~ SOLN
25.0000 [IU] | Freq: Every day | SUBCUTANEOUS | Status: DC
  Filled 2021-08-12: qty 0.25

## 2021-08-12 MED ORDER — CLOPIDOGREL BISULFATE 75 MG PO TABS
75.0000 mg | ORAL_TABLET | Freq: Every day | ORAL | Status: DC
  Administered 2021-08-12: 75 mg via ORAL
  Filled 2021-08-12: qty 1

## 2021-08-12 MED ORDER — ASPIRIN EC 325 MG PO TBEC
325.0000 mg | DELAYED_RELEASE_TABLET | Freq: Every day | ORAL | Status: DC
  Administered 2021-08-12: 325 mg via ORAL
  Filled 2021-08-12: qty 1

## 2021-08-12 MED ORDER — CLOPIDOGREL BISULFATE 75 MG PO TABS: 75.0000 mg | ORAL_TABLET | Freq: Every day | ORAL | 0 refills | Status: DC

## 2021-08-12 NOTE — Progress Notes (Signed)
HD#0 SUBJECTIVE:  Patient Summary:  Hannah Miller is a 70 y.o. with past medical history of Insulin-dependent Diabetes Mellitus, Hyperlipidemia, and Hypertension presented following an episode of altered mental status and admitted due to TIA.  Overnight Events: patient states that she rested well last night and she feels back to normal.   OBJECTIVE:  Vital Signs: Vitals:   08/12/21 1000 08/12/21 1030 08/12/21 1200 08/12/21 1400  BP: (!) 161/68 (!) 164/144 (!) 148/70 (!) 130/50  Pulse: 68 81 69 82  Resp:  '17 16 19  '$ Temp:      TempSrc:      SpO2: 100% 99% 100% 99%  Weight:      Height:       Supplemental O2: Room Air SpO2: 99 %  Filed Weights   08/11/21 1000  Weight: 91.6 kg     Intake/Output Summary (Last 24 hours) at 08/12/2021 1520 Last data filed at 08/12/2021 0730 Gross per 24 hour  Intake --  Output 900 ml  Net -900 ml   Net IO Since Admission: -900 mL [08/12/21 1520]  Physical Exam: General: well-developed, well-nourished HENT: NCAT Eyes: no scleral icterus, conjunctiva clear CV: no murmurs, rubs, or gallops Pulm: CTAB, normal pulmonary effort GI: no tenderness, bowel sounds present Neuro: Muscle strength 5/5 in elbow flexion and extension, 5/5 in knee flexion and extension, sensation normal bilaterally to upper and lower extremities in dermatomes C6, L5, S1. Cranial Nerves: II: visual acuity normal bilaterally,  II: pupils equal, round, reactive to light and accommodation,  III,IV,VI: extraocular muscles extra-ocular motions intact, V: facial light touch sensation normal bilaterally, VII: facial muscle function - upper normal bilaterally,  VII: facial muscle function - lower normal bilaterally,  IX: soft palate elevation normal bilaterally,  XI: trapezius strength normal bilaterally,  XI: sternocleidomastoid strength normal bilaterally,  Skin: warm and dry Psych: normal mood and affect  Patient Lines/Drains/Airways Status     Active  Line/Drains/Airways     Name Placement date Placement time Site Days   Peripheral IV 08/11/21 22 G 1" Anterior;Right Forearm 08/11/21  1253  Forearm  1            Pertinent Labs: CBC Latest Ref Rng & Units 08/12/2021 08/11/2021 08/11/2021  WBC 4.0 - 10.5 K/uL 6.3 - 7.7  Hemoglobin 12.0 - 15.0 g/dL 10.6(L) 12.6 11.6(L)  Hematocrit 36.0 - 46.0 % 33.8(L) 37.0 36.8  Platelets 150 - 400 K/uL 192 - 225    CMP Latest Ref Rng & Units 08/12/2021 08/11/2021 08/11/2021  Glucose 70 - 99 mg/dL 215(H) 169(H) 175(H)  BUN 8 - 23 mg/dL 8 8 7(L)  Creatinine 0.44 - 1.00 mg/dL 0.87 0.70 0.88  Sodium 135 - 145 mmol/L 136 143 140  Potassium 3.5 - 5.1 mmol/L 3.8 4.0 3.9  Chloride 98 - 111 mmol/L 101 105 105  CO2 22 - 32 mmol/L 26 - 24  Calcium 8.9 - 10.3 mg/dL 8.7(L) - 9.4  Total Protein 6.5 - 8.1 g/dL - - 7.5  Total Bilirubin 0.3 - 1.2 mg/dL - - 0.5  Alkaline Phos 38 - 126 U/L - - 64  AST 15 - 41 U/L - - 20  ALT 0 - 44 U/L - - 19    Recent Labs    08/11/21 2219 08/12/21 0903 08/12/21 1139  GLUCAP 242* 355* 279*     Pertinent Imaging: EEG adult  Result Date: 08/11/2021 Derek Jack, MD     08/11/2021  9:52 PM Routine EEG Report Hannah Miller is  a 70 y.o. female with a history of spells who is undergoing an EEG to evaluate for seizures. Report: This EEG was acquired with electrodes placed according to the International 10-20 electrode system (including Fp1, Fp2, F3, F4, C3, C4, P3, P4, O1, O2, T3, T4, T5, T6, A1, A2, Fz, Cz, Pz). The following electrodes were missing or displaced: none. The occipital dominant rhythm was 9-10 Hz. This activity is reactive to stimulation. Drowsiness was manifested by background fragmentation; deeper stages of sleep were not identified. There was no focal slowing. There were no interictal epileptiform discharges. There were no electrographic seizures identified. Photic stimulation and hyperventilation were not performed. Impression: This EEG was obtained while awake  and drowsy and is normal.   Clinical Correlation: Normal EEGs, however, do not rule out epilepsy. Su Monks, MD [facility] [phone] If 7pm- 7am, please page neurology on call as listed in [place].    ASSESSMENT/PLAN:  Assessment: Active Problems:   Transient ischemic attack (TIA)  Hannah Miller is a 70 y.o. with past medical history of Insulin-dependent Diabetes Mellitus, Hyperlipidemia, and Hypertension presented following an episode of altered mental status and admitted due to TIA. MRI showed severe intracranial stenosis.  Patient's symptoms have resolved.  Waiting on Echo to be completed.  Plan: #Transient Ischemic Attack Patient states that her symptoms have resolved.  Per neurology recommendations, patient will be discharged on Aspirin 325 mg for life and Plavix 75 mg for 90 days.  Will follow-up for .  Echo is still pending. -Plavix 75 mg -aspirin 325 mg  #Insulin-dependent diabetes mellitus, type 2 CBG elevated today in the 200s.  Will titrate insulin. A1c of 10.3. -Glargine 25 untis -Novolog meal coverage 6 units -SSI -CBG monitoring  #Hyperlipidemia LDL of 61 -Atorvastatin 40 mg   Best Practice: Diet: Diabetic diet IVF: Fluids: none VTE: Place and maintain sequential compression device Start: 08/12/21 1319 Code: Full Therapy Recs: None, DME: none Family Contact: [name] updated DISPO: Anticipated discharge today to Home pending  echo results .  Signature: Christiana Fuchs, D.O. Internal Medicine Resident, PGY-1 [facility]  Pager: [phone] 3:20 PM, 08/12/2021   Please contact the on call pager after 5 pm and on weekends at [phone].

## 2021-08-12 NOTE — ED Notes (Signed)
Pt assisted up to the recliner and sitting with feet up, call bell within reach, blanket covering her legs, water at bedside with bedside table. No acute changes noted. Will continue to monitor.

## 2021-08-12 NOTE — Progress Notes (Signed)
  Echocardiogram 2D Echocardiogram has been performed.  Merrie Roof F 08/12/2021, 4:55 PM

## 2021-08-12 NOTE — Progress Notes (Signed)
  Date: 08/12/2021  Patient name: Hannah Miller  Medical record number: [id number]  Date of birth: March 11, 1951   I have seen and evaluated Hannah Miller and discussed their care with the Residency Team.  In brief, patient is 70 year old female with a past medical history of type 2 diabetes, hyperlipidemia, hypertension who presented to the ED with an episode of confusion x1.  History obtained from chart as patient does not recall the episode clearly.  Patient was sitting at the kitchen table and then began to lean to the left in her chair, gripped the table and had difficulty speaking.  Patient was unable to follow commands from her daughter.  Patient did not have a witnessed seizure.  No bowel or bladder incontinence.  No chest pain, no shortness of breath, no palpitations, no lightheadedness, no syncope no tingling or numbness, no lightheadedness, no fevers or chills, no abdominal pain, no nausea or vomiting.  In the ED she was noted to have left lower extremity drift and flattening of left nasolabial fold.  Imaging including CT head and MRI showed no evidence of acute infarct.  Today, patient states that she feels well and is back at her baseline.  She denies any weakness or difficulty speaking.  She would like to go home today  PMHx, Fam Hx, and/or Soc Hx : As per resident admit note  Vitals:   08/12/21 1030 08/12/21 1200  BP: (!) 164/144 (!) 148/70  Pulse: 81 69  Resp: 17 16  Temp:    SpO2: 99% 100%   General: Awake, alert, oriented x3, NAD CVS: Regular rate and rhythm, normal heart sounds. Lungs: CTA bilaterally Abdomen: Soft, nontender, nondistended, normoactive bowel sounds Extremities: No edema noted, nontender to palpation Psych: Normal mood and affect HEENT: Normocephalic, atraumatic Neuro: Power 5 out of 5 bilateral upper and lower extremities, extraocular movements intact, oriented x3, sensation intact  Assessment and Plan: I have seen and evaluated the patient as outlined  above. I agree with the formulated Assessment and Plan as detailed in the residents' note, with the following changes:   1.  TIA: -Patient presented to the ED with an episode of confusion and difficulty speaking and was noted to have mild left lower extremity weakness as well as possible flattening of her left nasolabial fold.  All the symptoms appear to have resolved completely.  Imaging including CT head and MRI/MRA of her brain showed no evidence of an acute infarct.  However, she did have significant intracranial stenosis noted on her MRA. -Neuro follow-up and recommendations appreciated -Continue with dual antiplatelet therapy with aspirin 325 mg daily as well as Plavix 75 mg daily to complete a 90-month course and then continue with aspirin alone -EEG with no evidence of seizure activity -LDL of 61.  We will continue with Lipitor 40 mg daily -We will allow permissive hypertension for 24 to 48 hours and then resume patient's home lisinopril -Patient will need better control of her diabetes and this was discussed with her in detail -We will follow-up 2D echo.  If this is within normal limits patient should be stable for DC home today -No further work-up at this time  Hannah Contes, MD 9/19/20222:28 PM

## 2021-08-12 NOTE — Discharge Summary (Addendum)
Name: Hannah Miller MRN: [id number] DOB: 11-03-1951 70 y.o. PCP: Pcp, No  Date of Admission: 08/11/2021  9:38 AM Date of Discharge: 08/12/2021 Attending Physician: Aldine Contes, MD  Discharge Diagnosis: 1.  Transient ischemic attack  Discharge Medications: Allergies as of 08/12/2021   No Known Allergies      Medication List     STOP taking these medications    aspirin 81 MG tablet Replaced by: aspirin 325 MG EC tablet       TAKE these medications    aspirin 325 MG EC tablet Take 1 tablet (325 mg total) by mouth daily. Start taking on: August 13, 2021 Replaces: aspirin 81 MG tablet   atorvastatin 40 MG tablet Commonly known as: LIPITOR Take 40 mg by mouth daily.   cholecalciferol 25 MCG (1000 UNIT) tablet Commonly known as: VITAMIN D3 Take 1,000 Units by mouth daily.   clopidogrel 75 MG tablet Commonly known as: PLAVIX Take 1 tablet (75 mg total) by mouth daily. Start taking on: August 13, 2021   insulin lispro protamine-lispro (75-25) 100 UNIT/ML Susp injection Commonly known as: HUMALOG 75/25 MIX Inject 50 Units into the skin in the morning, at noon, and at bedtime.   lisinopril 5 MG tablet Commonly known as: ZESTRIL Take 5 mg by mouth daily.        Disposition and follow-up:   Hannah Miller was discharged from [facility] in Stable condition.  At the hospital follow up visit please address:  1.  Please continue patient on clopidogrel 75 mg daily for 90 days and aspirin 325 mg for life.  This is per the [facility] trial for severe intracranial stenosis.  2. Please also discuss with patient risk modifiable factors for decreasing stroke risk, including diabetes management, HTN, and Hyperlipidemia.  Her A1c in the hospital was at 10.3.  LDL of 61.    3. She did have normocytic anemia with Hbg of 10.6 and MCV of 83.3. Ferritin was wnl.    4. Echo showed EF of 55 to 60%. The left ventricle has normal function. Trivial  mitral regurg, mild aortic sclerosis.  5.  Labs / imaging needed at time of follow-up: CBC, consider further work up for anemia  6.  Pending labs/ test needing follow-up: none  Follow-up Appointments:  Follow-up Information     Primary Care Physician. Schedule an appointment as soon as possible for a visit in 1 week(s).          [facility]. Schedule an appointment as soon as possible for a visit in 1 month(s).   Contact information: [place], [place] [phone]               Asked patient to follow-up with her PCP in next week or two.  Hospital Course by problem list: 1.  Transient ischemic attack Patient presented following episode of altered mental status.  She was talking with her siblings and making coffee in the morning of September 18th.  She then went back to get a table and sat down and began to lean to the left in a chair, but the table, and have difficulty producing speech.  Her daughter called 911 at that time and patient was unable to follow prompts related by daughter dispatcher daughter stated that patient stared blankly back at her and the episode lasted about 5 minutes. No limb movement noted during episode and patient did not lose continence.  Patient was unable to recall what  happened during the event, but stated she just remembers when EMS personnel came in.  In the ED, patient was noted to have slight flattening of left nasolabial fold and left lower extremity drift.  CT head showed no acute intracranial hemorrhage or infarct.  There was concern for possible stroke versus seizure.  Spot EEG was obtained and was normal, but does not rule out epilepsy. On initial exam, patient was emotionally labile and deferred questions to her daughter.  No speech defects noted at that time.  An hour later, she was laughing and joking with staff and laughter and was back to her baseline per her daughter.  Her neuro exam was  within normal limits.  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  Patient was initiated on Clopidogrel 75 mg and Aspirin 325 mg by neurology, and will need to continue this regimen for the next three months.  Discussed with patient to follow-up with PCP for help with managing modifiable risk factors for stroke including diabetes, Hypertension, and hyperlipidemia.  Her A1c was 10.3 and LDL at goal at 61.      Discharge Exam:   BP (!) 164/71   Pulse 77   Temp 98.3 F (36.8 C)   Resp (!) 21   Ht 5\' 6"  (1.676 m)   Wt 91.6 kg   SpO2 100%   BMI 32.60 kg/m  Discharge exam:  General: well-developed, well-nourished HENT: NCAT Eyes: no scleral icterus, conjunctiva clear CV: no murmurs, rubs, or gallops Pulm: CTAB, normal pulmonary effort GI: no tenderness, bowel sounds present Neuro: Muscle strength 5/5 in elbow flexion and extension, 5/5 in knee flexion and extension, sensation normal bilaterally to upper and lower extremities in dermatomes C6, L5, S1. Cranial Nerves: II: visual acuity normal bilaterally,  II: pupils equal, round, reactive to light and accommodation,  III,IV,VI: extraocular muscles extra-ocular motions intact, V: facial light touch sensation normal bilaterally, VII: facial muscle function - upper normal bilaterally,  VII: facial muscle function - lower normal bilaterally,  IX: soft palate elevation normal bilaterally,  XI: trapezius strength normal bilaterally,  XI: sternocleidomastoid strength normal bilaterally,  Skin: warm and dry Psych: normal mood and affect  Pertinent Labs, Studies, and Procedures:  CBC Latest Ref Rng & Units 08/12/2021 08/11/2021 08/11/2021  WBC 4.0 - 10.5 K/uL 6.3 - 7.7  Hemoglobin 12.0 - 15.0 g/dL  10.6(L) 12.6 11.6(L)  Hematocrit 36.0 - 46.0 % 33.8(L) 37.0 36.8  Platelets 150 - 400 K/uL 192 - 225    BMP Latest Ref Rng & Units 08/12/2021 08/11/2021 08/11/2021  Glucose 70 - 99 mg/dL 215(H) 169(H) 175(H)  BUN 8 - 23 mg/dL 8 8 7(L)  Creatinine 0.44 - 1.00 mg/dL 0.87 0.70 0.88  Sodium 135 - 145 mmol/L 136 143 140  Potassium 3.5 - 5.1 mmol/L 3.8 4.0 3.9  Chloride 98 - 111 mmol/L 101 105 105  CO2 22 - 32 mmol/L 26 - 24  Calcium 8.9 - 10.3 mg/dL 8.7(L) - 9.4     Discharge Instructions: Discharge Instructions     Ambulatory referral to Neurology   Complete by: As directed    Pt was seen at [facility] for seizure vs. TIA. Recommend to follow up with neurologist at [facility] within one month regarding further management, especially regarding driving recommendations. Thank you.  Call MD for:  difficulty breathing, headache or visual disturbances   Complete by: As directed    Call MD for:  extreme fatigue   Complete by: As directed    Call MD for:  hives   Complete by: As directed    Call MD for:  persistant dizziness or light-headedness   Complete by: As directed    Call MD for:  persistant nausea and vomiting   Complete by: As directed    Call MD for:  redness, tenderness, or signs of infection (pain, swelling, redness, odor or green/yellow discharge around incision site)   Complete by: As directed    Call MD for:  severe uncontrolled pain   Complete by: As directed    Call MD for:  temperature >100.4   Complete by: As directed    Diet - low sodium heart healthy   Complete by: As directed    Discharge instructions   Complete by: As directed    Hannah Miller, I am so glad you are feeling better! You were admitted because of a mini-stroke. In order to decrease the risk of any future strokes, it is important that your blood pressure, cholesterol, and diabetes are well-controlled. Please see the following notes:  - You will take aspirin 325mg  and clopidogrel (PLAVIX) 75mg  daily. After 90 days, you  can stop taking clopidogrel. You will then take only aspirin 325mg  daily.  - You will continue to take atorvastatin (LIPITOR) 40mg  daily for cholesterol.  - For your diabetes, please make sure to follow-up with your primary care physician. They will also help controlling your blood pressure.   - Please follow-up with your primary care physician in one week.  It was a pleasure meeting you, Hannah Miller. I wish you the best and hope you stay happy and healthy!  Thank you, Christiana Fuchs, DO Sanjuan Dame, MD   Increase activity slowly   Complete by: As directed       ECHOCARDIOGRAM COMPLETE  Result Date: 08/12/2021    ECHOCARDIOGRAM REPORT   Patient Name:   Hannah Miller Date of Exam: 08/12/2021 Medical Rec #:  [id number]      Height:       66.0 in Accession #:    [id number]     Weight:       202.0 lb Date of Birth:  02-16-1951       BSA:          2.008 m Patient Age:    24 years       BP:           139/77 mmHg Patient Gender: F              HR:           70 bpm. Exam Location:  Inpatient Procedure: 2D Echo, Cardiac Doppler and Color Doppler Indications:    TIA G45.9  History:        Patient has no prior history of Echocardiogram examinations.  Sonographer:    Merrie Roof RDCS Referring Phys: [id number] [place]  1. Left ventricular ejection fraction, by estimation, is 55 to 60%. The left ventricle has normal function. The left ventricle has no regional wall motion abnormalities. Left ventricular diastolic parameters are indeterminate.  2. Right ventricular systolic function is normal. The right ventricular size is normal.  3. The mitral valve is normal in structure. Trivial mitral valve regurgitation.  4. The aortic valve is tricuspid. Aortic valve regurgitation is not visualized. Mild aortic  valve sclerosis is present, with no evidence of aortic valve stenosis. FINDINGS  Left Ventricle: Left ventricular ejection fraction, by estimation, is 55 to 60%. The left ventricle has normal  function. The left ventricle has no regional wall motion abnormalities. The left ventricular internal cavity size was normal in size. There is  no left ventricular hypertrophy. Left ventricular diastolic parameters are indeterminate. Right Ventricle: The right ventricular size is normal. Right vetricular wall thickness was not assessed. Right ventricular systolic function is normal. Left Atrium: Left atrial size was normal in size. Right Atrium: Right atrial size was normal in size. Pericardium: Trivial pericardial effusion is present. Mitral Valve: The mitral valve is normal in structure. Trivial mitral valve regurgitation. Tricuspid Valve: The tricuspid valve is normal in structure. Tricuspid valve regurgitation is trivial. Aortic Valve: The aortic valve is tricuspid. Aortic valve regurgitation is not visualized. Mild aortic valve sclerosis is present, with no evidence of aortic valve stenosis. Aortic valve mean gradient measures 6.0 mmHg. Aortic valve peak gradient measures 10.0 mmHg. Aortic valve area, by VTI measures 1.86 cm. Pulmonic Valve: The pulmonic valve was normal in structure. Pulmonic valve regurgitation is not visualized. Aorta: The aortic root and ascending aorta are structurally normal, with no evidence of dilitation. IAS/Shunts: The interatrial septum was not assessed.  LEFT VENTRICLE PLAX 2D LVIDd:         4.60 cm  Diastology LVIDs:         3.20 cm  LV e' medial:    4.68 cm/s LV PW:         1.10 cm  LV E/e' medial:  15.5 LV IVS:        0.90 cm  LV e' lateral:   7.40 cm/s LVOT diam:     1.90 cm  LV E/e' lateral: 9.8 LV SV:         63 LV SV Index:   31 LVOT Area:     2.84 cm  RIGHT VENTRICLE RV Basal diam:  3.20 cm LEFT ATRIUM             Index       RIGHT ATRIUM           Index LA diam:        3.60 cm 1.79 cm/m  RA Area:     17.10 cm LA Vol (A2C):   68.6 ml 34.16 ml/m RA Volume:   42.20 ml  21.01 ml/m LA Vol (A4C):   41.0 ml 20.42 ml/m LA Biplane Vol: 55.7 ml 27.74 ml/m  AORTIC VALVE AV  Area (Vmax):    1.88 cm AV Area (Vmean):   1.82 cm AV Area (VTI):     1.86 cm AV Vmax:           158.00 cm/s AV Vmean:          111.000 cm/s AV VTI:            0.339 m AV Peak Grad:      10.0 mmHg AV Mean Grad:      6.0 mmHg LVOT Vmax:         105.00 cm/s LVOT Vmean:        71.200 cm/s LVOT VTI:          0.222 m LVOT/AV VTI ratio: 0.65  AORTA Ao Root diam: 2.70 cm Ao Asc diam:  3.10 cm MITRAL VALVE MV Area (PHT): 3.77 cm    SHUNTS MV Decel Time: 201 msec    Systemic VTI:  0.22 m  MV E velocity: 72.50 cm/s  Systemic Diam: 1.90 cm MV A velocity: 89.40 cm/s MV E/A ratio:  0.81 Dorris Carnes MD Electronically signed by Dorris Carnes MD Signature Date/Time: 08/12/2021/7:43:02 PM    Final       Signed: Scarlett Presto, MD 08/12/2021, 8:24 PM   Pager: [phone]

## 2021-08-12 NOTE — ED Notes (Signed)
Breakfast Ordered 

## 2021-08-12 NOTE — Progress Notes (Signed)
Inpatient Diabetes Program Recommendations  AACE/ADA: New Consensus Statement on Inpatient Glycemic Control (2015)  Target Ranges:  Prepandial:   less than 140 mg/dL      Peak postprandial:   less than 180 mg/dL (1-2 hours)      Critically ill patients:  140 - 180 mg/dL  Results for Hannah Miller, Hannah Miller (MRN [id number]) as of 08/12/2021 13:00  Ref. Range 08/11/2021 10:55 08/11/2021 18:17 08/11/2021 22:19 08/12/2021 09:03 08/12/2021 11:39  Glucose-Capillary Latest Ref Range: 70 - 99 mg/dL 173 (H) 268 (H)  8 units Novolog  242 (H)     10 units Semglee 355 (H)  15 units Novolog  279 (H)  8 units Novolog     Home DM Meds: Humalog 75/25 Insulin 50 units TID    Current Orders: Semglee 10 units QHS  Novolog 0-15 units TID     MD- Please consider:  1. Increase Semglee to 25 units QHS (0.3 units/kg)  Per record review, pt takes 70/30 Insulin 50 units TID at home  2. Start Novolog Meal Coverage: Novolog 6 units TID with meals  Hold if pt eats <50% of meal, Hold if pt NPO    --Will follow patient during hospitalization--  Wyn Quaker RN, MSN, CDE Diabetes Coordinator Inpatient Glycemic Control Team Team Pager: [phone] (8a-5p)

## 2021-08-12 NOTE — Progress Notes (Addendum)
STROKE TEAM PROGRESS NOTE   INTERVAL HISTORY No acute events since arrival. BP has been elevated again this morning at 174/69. BG 200s. Afebrile. SR with HR 60-70s.  Today patient is sitting up in chair fully conversant and interactive. She is joking about the episode and feeling back to normal. She only recalls that she was standing at the coffee maker and then the next thing she knew the room was full of EMS personnel. Daughter who was present to witness the event describes via speaker phone that the patient suddenly started to stutter and asked out loud "Why am I slurring?" She then went and sat down at the kitchen table and stuttered more then leaned to the left with hands braced on table and began to stare off with glassy eyes and remained unresponsive to family members for about 5 minutes. 911 operator requested smile, arm lifts and sentence repeating. Patient was able to perform these after delayed response time with slow efforts. Daughter did not note any facial droop arm, unclear speech/dysarthia or arm weakness at that point. She then began to slowly regain her speech over the next several minutes.   Patient is retired from [employer] but still is working full time as a Research scientist (physical sciences) and living independently.  Another daughter is present in the room. Dr. Erlinda Hong discussed her possible diagnoses for the spell including but not limited to TIA vs. Seizure. He explained ongoing work up and plan of care. Still awaiting 2D Echo. DAPT was explained. Driving restriction until follow up with neurology was also recommended.   Vitals:   08/12/21 0515 08/12/21 0530 08/12/21 0630 08/12/21 0700  BP:  (!) 145/52 (!) 174/69 (!) 180/75  Pulse: 64 62 75 71  Resp:  '16 16 18  '$ Temp:      TempSrc:      SpO2: 97% 97% 99% 97%  Weight:      Height:       CBC:  Recent Labs  Lab 08/11/21 1011 08/11/21 1013 08/12/21 0344  WBC 7.7  --  6.3  NEUTROABS 2.8  --   --   HGB 11.6* 12.6 10.6*  HCT 36.8  37.0 33.8*  MCV 83.3  --  83.3  PLT 225  --  [id number]   Basic Metabolic Panel:  Recent Labs  Lab 08/11/21 1011 08/11/21 1013 08/12/21 0344  NA 140 143 136  K 3.9 4.0 3.8  CL 105 105 101  CO2 24  --  26  GLUCOSE 175* 169* 215*  BUN 7* 8 8  CREATININE 0.88 0.70 0.87  CALCIUM 9.4  --  8.7*   Lipid Panel:  Recent Labs  Lab 08/11/21 1233  CHOL 116  TRIG 105  HDL 34*  CHOLHDL 3.4  VLDL 21  LDLCALC 61   HgbA1c: No results for input(s): HGBA1C in the last 168 hours. Urine Drug Screen: No results for input(s): LABOPIA, COCAINSCRNUR, LABBENZ, AMPHETMU, THCU, LABBARB in the last 168 hours.  Alcohol Level No results for input(s): ETH in the last 168 hours.  IMAGING past 24 hours MR ANGIO HEAD WO CONTRAST  Result Date: 08/11/2021 CLINICAL DATA:  Neuro deficit, acute, stroke suspected; Stroke, follow up. 5 minutes of behavioral arrest with word finding difficulty need to rule out seizure and TIA. EXAM: MRI HEAD WITHOUT CONTRAST MRA HEAD WITHOUT CONTRAST MRA NECK WITHOUT CONTRAST TECHNIQUE: Multiplanar, multiecho pulse sequences of the brain and surrounding structures were obtained without intravenous contrast. Angiographic images of the Circle of Willis were obtained  using MRA technique without intravenous contrast. Angiographic images of the neck were obtained using MRA technique without intravenous contrast. Carotid stenosis measurements (when applicable) are obtained utilizing NASCET criteria, using the distal internal carotid diameter as the denominator. COMPARISON:  Head CT 08/11/2021 FINDINGS: MRI HEAD FINDINGS The examination had to be discontinued prior to completion due to back pain. Axial and coronal diffusion, axial FLAIR, and SWAN sequences were obtained. Brain: There is no evidence of an acute infarct, intracranial hemorrhage, mass, midline shift, or extra-axial fluid collection. The ventricles and sulci are normal. T2 hyperintensities in the cerebral white matter bilaterally are  nonspecific but compatible with mild chronic small vessel ischemic disease. Vascular: As below. Skull and upper cervical spine: No destructive skull lesion. Sinuses/Orbits: Mild bilateral ethmoid air cell mucosal thickening. Trace left mastoid fluid. Grossly unremarkable orbits. Other: 1 cm T2 hyperintense/cystic focus in the midline of the posterior oropharynx. MRA HEAD FINDINGS The intracranial vertebral arteries are widely patent to the basilar. Patent PICA, AICA, and SCA origins are seen bilaterally with a normal variant common trunk of the right PICA and AICA. The basilar artery is widely patent. There are patent posterior communicating arteries bilaterally. Both PCAs are patent without evidence of a significant stenosis on the right. There is a severe stenosis of a proximal left P3 branch vessel. The internal carotid arteries are patent from skull base to carotid termini with moderate left and moderate to severe right supraclinoid stenoses. The M1 segments are patent with a mild-to-moderate stenosis on the right. A 4 mm long flow gap in the proximal left M2 superior division may reflect a high-grade stenosis or short segment occlusion with distal reconstitution. There is a mild proximal right M2 stenosis. The left A1 segment is widely patent and dominant. The right A1 segment is small and poorly visualized in its proximal to midportion suggestive of an underlying severe stenosis. No aneurysm is identified. MRA NECK FINDINGS There is a standard 3 vessel aortic arch with widely patent arch vessel origins. The common carotid and cervical internal carotid arteries are patent without evidence of a dissection or significant stenosis. The vertebral arteries are patent and codominant with antegrade flow bilaterally. Assessment of the V1 segments is mildly limited by artifact. An apparent left V1 stenosis on reformats is felt to be due to mild vessel tortuosity and kinking based on source images. There is no evidence  of a significant vertebral artery stenosis or dissection more distally on either side. IMPRESSION: 1. Incomplete head MRI.  No acute intracranial abnormality. 2. Mild chronic small vessel ischemic disease. 3. Intracranial atherosclerosis including moderate to severe supraclinoid ICA stenoses, severe stenosis or short segment occlusion of the proximal left M2 superior division, and a severe right A1 stenosis. 4. Negative neck MRA. Electronically Signed   By: Logan Bores M.D.   On: 08/11/2021 12:17   MR ANGIO NECK WO CONTRAST  Result Date: 08/11/2021 CLINICAL DATA:  Neuro deficit, acute, stroke suspected; Stroke, follow up. 5 minutes of behavioral arrest with word finding difficulty need to rule out seizure and TIA. EXAM: MRI HEAD WITHOUT CONTRAST MRA HEAD WITHOUT CONTRAST MRA NECK WITHOUT CONTRAST TECHNIQUE: Multiplanar, multiecho pulse sequences of the brain and surrounding structures were obtained without intravenous contrast. Angiographic images of the Circle of Willis were obtained using MRA technique without intravenous contrast. Angiographic images of the neck were obtained using MRA technique without intravenous contrast. Carotid stenosis measurements (when applicable) are obtained utilizing NASCET criteria, using the distal internal carotid diameter as the  denominator. COMPARISON:  Head CT 08/11/2021 FINDINGS: MRI HEAD FINDINGS The examination had to be discontinued prior to completion due to back pain. Axial and coronal diffusion, axial FLAIR, and SWAN sequences were obtained. Brain: There is no evidence of an acute infarct, intracranial hemorrhage, mass, midline shift, or extra-axial fluid collection. The ventricles and sulci are normal. T2 hyperintensities in the cerebral white matter bilaterally are nonspecific but compatible with mild chronic small vessel ischemic disease. Vascular: As below. Skull and upper cervical spine: No destructive skull lesion. Sinuses/Orbits: Mild bilateral ethmoid air cell  mucosal thickening. Trace left mastoid fluid. Grossly unremarkable orbits. Other: 1 cm T2 hyperintense/cystic focus in the midline of the posterior oropharynx. MRA HEAD FINDINGS The intracranial vertebral arteries are widely patent to the basilar. Patent PICA, AICA, and SCA origins are seen bilaterally with a normal variant common trunk of the right PICA and AICA. The basilar artery is widely patent. There are patent posterior communicating arteries bilaterally. Both PCAs are patent without evidence of a significant stenosis on the right. There is a severe stenosis of a proximal left P3 branch vessel. The internal carotid arteries are patent from skull base to carotid termini with moderate left and moderate to severe right supraclinoid stenoses. The M1 segments are patent with a mild-to-moderate stenosis on the right. A 4 mm long flow gap in the proximal left M2 superior division may reflect a high-grade stenosis or short segment occlusion with distal reconstitution. There is a mild proximal right M2 stenosis. The left A1 segment is widely patent and dominant. The right A1 segment is small and poorly visualized in its proximal to midportion suggestive of an underlying severe stenosis. No aneurysm is identified. MRA NECK FINDINGS There is a standard 3 vessel aortic arch with widely patent arch vessel origins. The common carotid and cervical internal carotid arteries are patent without evidence of a dissection or significant stenosis. The vertebral arteries are patent and codominant with antegrade flow bilaterally. Assessment of the V1 segments is mildly limited by artifact. An apparent left V1 stenosis on reformats is felt to be due to mild vessel tortuosity and kinking based on source images. There is no evidence of a significant vertebral artery stenosis or dissection more distally on either side. IMPRESSION: 1. Incomplete head MRI.  No acute intracranial abnormality. 2. Mild chronic small vessel ischemic disease.  3. Intracranial atherosclerosis including moderate to severe supraclinoid ICA stenoses, severe stenosis or short segment occlusion of the proximal left M2 superior division, and a severe right A1 stenosis. 4. Negative neck MRA. Electronically Signed   By: Logan Bores M.D.   On: 08/11/2021 12:17   MR BRAIN WO CONTRAST  Result Date: 08/11/2021 CLINICAL DATA:  Neuro deficit, acute, stroke suspected; Stroke, follow up. 5 minutes of behavioral arrest with word finding difficulty need to rule out seizure and TIA. EXAM: MRI HEAD WITHOUT CONTRAST MRA HEAD WITHOUT CONTRAST MRA NECK WITHOUT CONTRAST TECHNIQUE: Multiplanar, multiecho pulse sequences of the brain and surrounding structures were obtained without intravenous contrast. Angiographic images of the Circle of Willis were obtained using MRA technique without intravenous contrast. Angiographic images of the neck were obtained using MRA technique without intravenous contrast. Carotid stenosis measurements (when applicable) are obtained utilizing NASCET criteria, using the distal internal carotid diameter as the denominator. COMPARISON:  Head CT 08/11/2021 FINDINGS: MRI HEAD FINDINGS The examination had to be discontinued prior to completion due to back pain. Axial and coronal diffusion, axial FLAIR, and SWAN sequences were obtained. Brain: There is no  evidence of an acute infarct, intracranial hemorrhage, mass, midline shift, or extra-axial fluid collection. The ventricles and sulci are normal. T2 hyperintensities in the cerebral white matter bilaterally are nonspecific but compatible with mild chronic small vessel ischemic disease. Vascular: As below. Skull and upper cervical spine: No destructive skull lesion. Sinuses/Orbits: Mild bilateral ethmoid air cell mucosal thickening. Trace left mastoid fluid. Grossly unremarkable orbits. Other: 1 cm T2 hyperintense/cystic focus in the midline of the posterior oropharynx. MRA HEAD FINDINGS The intracranial vertebral  arteries are widely patent to the basilar. Patent PICA, AICA, and SCA origins are seen bilaterally with a normal variant common trunk of the right PICA and AICA. The basilar artery is widely patent. There are patent posterior communicating arteries bilaterally. Both PCAs are patent without evidence of a significant stenosis on the right. There is a severe stenosis of a proximal left P3 branch vessel. The internal carotid arteries are patent from skull base to carotid termini with moderate left and moderate to severe right supraclinoid stenoses. The M1 segments are patent with a mild-to-moderate stenosis on the right. A 4 mm long flow gap in the proximal left M2 superior division may reflect a high-grade stenosis or short segment occlusion with distal reconstitution. There is a mild proximal right M2 stenosis. The left A1 segment is widely patent and dominant. The right A1 segment is small and poorly visualized in its proximal to midportion suggestive of an underlying severe stenosis. No aneurysm is identified. MRA NECK FINDINGS There is a standard 3 vessel aortic arch with widely patent arch vessel origins. The common carotid and cervical internal carotid arteries are patent without evidence of a dissection or significant stenosis. The vertebral arteries are patent and codominant with antegrade flow bilaterally. Assessment of the V1 segments is mildly limited by artifact. An apparent left V1 stenosis on reformats is felt to be due to mild vessel tortuosity and kinking based on source images. There is no evidence of a significant vertebral artery stenosis or dissection more distally on either side. IMPRESSION: 1. Incomplete head MRI.  No acute intracranial abnormality. 2. Mild chronic small vessel ischemic disease. 3. Intracranial atherosclerosis including moderate to severe supraclinoid ICA stenoses, severe stenosis or short segment occlusion of the proximal left M2 superior division, and a severe right A1 stenosis.  4. Negative neck MRA. Electronically Signed   By: Logan Bores M.D.   On: 08/11/2021 12:17   EEG adult  Result Date: 08/11/2021 Derek Jack, MD     08/11/2021  9:52 PM Routine EEG Report Candie Busam is a 70 y.o. female with a history of spells who is undergoing an EEG to evaluate for seizures. Report: This EEG was acquired with electrodes placed according to the International 10-20 electrode system (including Fp1, Fp2, F3, F4, C3, C4, P3, P4, O1, O2, T3, T4, T5, T6, A1, A2, Fz, Cz, Pz). The following electrodes were missing or displaced: none. The occipital dominant rhythm was 9-10 Hz. This activity is reactive to stimulation. Drowsiness was manifested by background fragmentation; deeper stages of sleep were not identified. There was no focal slowing. There were no interictal epileptiform discharges. There were no electrographic seizures identified. Photic stimulation and hyperventilation were not performed. Impression: This EEG was obtained while awake and drowsy and is normal.   Clinical Correlation: Normal EEGs, however, do not rule out epilepsy. Su Monks, MD [facility] [phone] If 7pm- 7am, please page neurology on call as listed in [place].   CT HEAD CODE STROKE WO CONTRAST  Result Date: 08/11/2021 CLINICAL DATA:  Code stroke.  Left lower extremity weakness EXAM: CT HEAD WITHOUT CONTRAST TECHNIQUE: Contiguous axial images were obtained from the base of the skull through the vertex without intravenous contrast. COMPARISON:  None. FINDINGS: Brain: There is no acute intracranial hemorrhage, extra-axial fluid collection, or acute infarct. The ventricles are not enlarged. There is no mass lesion. There is no midline shift. Vascular: There is calcification of the bilateral cavernous ICAs. No hyperdense vessel is seen. Skull: Normal. Negative for fracture or focal lesion. Sinuses/Orbits: No acute finding. Other: None. ASPECTS [facility] Stroke Program Early CT Score) - Ganglionic level  infarction (caudate, lentiform nuclei, internal capsule, insula, M1-M3 cortex): 7 - Supraganglionic infarction (M4-M6 cortex): 3 Total score (0-10 with 10 being normal): 10 IMPRESSION: 1. No acute intracranial hemorrhage or infarct. 2. ASPECTS is 10 Electronically Signed   By: Valetta Mole M.D.   On: 08/11/2021 10:23    PHYSICAL EXAM  Temp:  [98.3 F (36.8 C)] 98.3 F (36.8 C) (09/19 0400) Pulse Rate:  [62-87] 82 (09/19 1400) Resp:  [12-26] 19 (09/19 1400) BP: (130-180)/(49-144) 130/50 (09/19 1400) SpO2:  [95 %-100 %] 99 % (09/19 1400)  General - Obese,  well developed, sitting in chair in ED room in no apparent distress.  Ophthalmologic - fundi not visualized due to noncooperation.  Cardiovascular - Regular rhythm and rate on cardiac monitor  Mental Status -  Level of arousal and orientation to time, place, and person were intact. Language including expression, naming, repetition, comprehension was assessed and found intact. Attention span and concentration were normal. Recent and remote memory were intact. Fund of Knowledge was assessed and was intact.  Cranial Nerves II - XII - II - Visual field intact OU. III, IV, VI - Extraocular movements intact. V - Facial sensation intact bilaterally. VII - Facial movement intact bilaterally. VIII - Hearing & vestibular intact bilaterally. X - Palate elevates symmetrically. XI - Chin turning & shoulder shrug intact bilaterally. XII - Tongue protrusion intact.  Motor Strength - The patient's strength was normal in all extremities and pronator drift was absent.  Bulk was normal and fasciculations were absent.   Motor Tone - Muscle tone was assessed at the neck and appendages and was normal.  Reflexes - The patient's reflexes were symmetrical in all extremities and she had no pathological reflexes.  Sensory - Light touch, temperature/pinprick were assessed and were symmetrical.    Coordination - The patient had normal movements in the  hands and feet with no ataxia or dysmetria. Tremor was absent.  Gait and Station - deferred.   ASSESSMENT/PLAN Jenneth Gilberti is a 70 y.o. with past medical history of Insulin-dependent Diabetes Mellitus, Hyperlipidemia, and Hypertension presented following an episode of altered mental status x 5 minutes . Episode consisted of suddenly beginning to lean to the left in her chair, grip the table, and obvious inability to speak or respond to commands. Her daughter called 911. A slight flattening of her left nasolabial fold and left lower extremity drift along BP 160s/100 was noted in ED. NIHSS 0. Patient was amnesic to event. CT head showed no acute intracranial hemorrhage.   TIA due to risk factors vs. Seizure Sudden event of unresponsive with staring off, initially stuttering then aphasic, slow responding x approximately 5 minutes, then amnesic to event.  EEG normal  Code Stroke CT Head CT head No acute abnormality.  MRI/MRA No acute intracranial abnormality. Moderate to severe supraclinoid ICA stenoses, severe stenosis or short segment occlusion  of the proximal left M2 superior division, and a severe right A1 stenosis. Negative neck MRA. 2D Echo PENDING LDL 61 HgbA1c 10.3 VTE prophylaxis - SCDs Taking '81mg'$  ASA prior to admission, Recommend ASA '325mg'$  and PLAVIX x 3 months then plavix alone  Therapy recommendations:  None Disposition:  Home  Recommended NO DRIVING until neurology follow up in about 4 weeks   Hypertension Home meds:  5 mg Lisinopril Long-term BP goal 130-150 given intracranial stenosis Close PCP Follow up   Hyperlipidemia Home meds: Lipitor '40mg'$   LDL at goal 61, goal < 70 Continue lipitor 40 Continue statin at discharge  Diabetes type II Uncontrolled HgbA1c 10.3 CBGs SSI Management per primary team. Close PCP/Endocrinology follow up for improved control.  Other Stroke Risk Factors Advanced Age >/= 69  History of Cigarette smoing  Obesity, Body mass index is  32.6 kg/m., BMI >/= 30 associated with increased stroke risk, recommend weight loss, diet and exercise as appropriate   Other Active Problems   Hospital day # 0  Neurology will sign off. Please call with questions. Pt will follow up with [facility] in about 4 weeks. Thanks for the consult.   Rosalin Hawking, MD PhD Stroke Neurology 08/12/2021 6:11 PM    Rosalin Hawking, MD PhD Stroke Neurology 08/12/2021 6:10 PM    To contact Stroke Continuity provider, please refer to [email or web address] After hours, contact General Neurology

## 2021-08-12 NOTE — ED Notes (Signed)
Pt reported no BM since arrival to hospital, requested something to help with constipated. Standing orders initiated. Pt sitting up in recliner, denies needs. Hospitalist team rounded. Pt has call light and phone in reach.

## 2021-08-12 NOTE — Plan of Care (Signed)
After consult note written yesterday, NP was informed by teaching service resident that patient would be on Xarelto as VTE prophylaxis to avoid injection this hospitalization. Therefore, our management for secondary stroke prevention will change during the hospitalization. Patient will need ASA '81mg'$  po qd with the Xarelto while hospitalized. When Xarelto is stopped (I assume at discharge), patient should be discharged on Plavix 75 mg po qd x 90 days and ASA '325mg'$  po qd for life.   Communicated to teaching service.   Clance Boll, MSN, APN-BC Neurology Nurse Practitioner Pager [phone]

## 2021-08-13 ENCOUNTER — Encounter: Payer: Self-pay | Admitting: Neurology

## 2021-08-15 ENCOUNTER — Other Ambulatory Visit: Payer: Self-pay

## 2021-08-15 ENCOUNTER — Encounter: Payer: Self-pay | Admitting: Neurology

## 2021-08-15 ENCOUNTER — Ambulatory Visit (INDEPENDENT_AMBULATORY_CARE_PROVIDER_SITE_OTHER): Payer: Medicare Other | Admitting: Neurology

## 2021-08-15 VITALS — BP 130/78 | HR 89 | Ht 66.0 in | Wt 205.2 lb

## 2021-08-15 DIAGNOSIS — R404 Transient alteration of awareness: Secondary | ICD-10-CM | POA: Diagnosis not present

## 2021-08-15 DIAGNOSIS — I672 Cerebral atherosclerosis: Secondary | ICD-10-CM

## 2021-08-15 DIAGNOSIS — R200 Anesthesia of skin: Secondary | ICD-10-CM

## 2021-08-15 MED ORDER — CLOPIDOGREL BISULFATE 75 MG PO TABS: 75.0000 mg | ORAL_TABLET | Freq: Every day | ORAL | 11 refills | Status: AC

## 2021-08-15 NOTE — Patient Instructions (Signed)
Schedule 24-hour EEG  2. Schedule EMG/NCV of both upper extremities  3. Continue aspirin 325mg  daily and Plavix 75mg  daily  4. As per [place] driving laws, no driving after an episode of loss of awareness until 6 months event-free  5. Follow-up after tests, call for any changes

## 2021-08-15 NOTE — Progress Notes (Signed)
NEUROLOGY CONSULTATION NOTE  Hannah Miller MRN: [id number] DOB: 06-Dec-1950  Referring provider: Dr. Rosalin Hawking Primary care provider: Arthur Holms, NP  Reason for consult:  stroke-like and seizure-like activity  Dear Dr Erlinda Hong:  Thank you for your kind referral of Hannah Miller for consultation of the above symptoms. Although her history is well known to you, please allow me to reiterate it for the purpose of our medical record. The patient was accompanied to the clinic by her daughter Hannah Miller who also provides collateral information. Records and images were personally reviewed where available.   HISTORY OF PRESENT ILLNESS: This is a 70 year old right-handed woman with a history of hypertension, hyperlipidemia, diabetes, presenting for evaluation of a transient episode of loss of awareness last 08/11/2021. She was with family and recalls talking to them about their coffeemaker, she sat down, then her next recollection is seeing EMS coming to the door. She recalls feeling fine with no headache, dizziness, focal weakness. She could not recall what he asked but recalls saying she was scared. Hannah Miller reports that while she was sitting, she was about to respond but said "b-b-b" repeatedly. They initially thought she was joking, then she closed her eyes for a second and again said "b-b-b," grabbed hold of the table then her head slowly bent to the right side and she was in a daze, unresponsive to family. They called 911 and family was instructed to ask her questions, she was still glassy but looking around, able to smile and raise her arms after a little delay. She could not repeat a phrase and kept staring at [place], then repeated the first part after the third time. This lasted 2-3 minutes. She was talking when EMS arrived but still with a little delay. No tongue bite or urinary incontinence. They recall BP was a little elevated. In the ER, reportedly she had slight flattening of the left nasolabial fold and  left leg drift. NIHSS was 0. She had a normal wake and drowsy EEG. MRI brain did not show any acute changes. 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 MRA neck normal. Echo showed EF 55-60%, normal left atrium size. LDL 61. HbA1c 10.3. She was discharged home on DAPT with Plavix for 90 days and ASA 325mg  daily for life.   They deny any prior similar episodes, none since hospital discharge. Etiology of episode unclear if TIA or seizure. She and her daughter any other staring episodes, gaps in time, olfactory/gustatory hallucinations, deja vu, rising epigastric sensation. She denies any headaches, dizziness, diplopia, dysarthria/dysphagia, neck/back pain. Since July, she has had constant numbness and stiffness in her hands, dropping things. She was putting on makeup one time and realized the applicator was not in her hand. She has throbbing pain in her right forearm and had twitching a minute ago in the right arm. Feet are not affected. She manages her own medications and denies forgetting medications. She works as a Pharmacologist. She had a normal birth and early development.  There is no history of febrile convulsions, CNS infections such as meningitis/encephalitis, significant traumatic brain injury, neurosurgical procedures, or family history of seizures.    PAST MEDICAL HISTORY: Past Medical History:  Diagnosis Date   Diabetes mellitus without complication ([place])     PAST SURGICAL HISTORY: No past surgical history on file.  MEDICATIONS: Current Outpatient Medications on File Prior to Visit  Medication  Sig Dispense Refill   aspirin EC 325 MG EC tablet Take 1 tablet (325 mg total) by mouth daily. 30 tablet 0   atorvastatin (LIPITOR) 40 MG tablet Take 40 mg by mouth daily.     cholecalciferol (VITAMIN D3) 25 MCG (1000 UNIT) tablet Take 1,000 Units by mouth  daily.     clopidogrel (PLAVIX) 75 MG tablet Take 1 tablet (75 mg total) by mouth daily. 30 tablet 0   insulin lispro protamine-insulin lispro (HUMALOG 75/25) (75-25) 100 UNIT/ML SUSP Inject 50 Units into the skin in the morning, at noon, and at bedtime.     lisinopril (PRINIVIL,ZESTRIL) 5 MG tablet Take 5 mg by mouth daily.     [DISCONTINUED] metFORMIN (GLUCOPHAGE) 1000 MG tablet Take 1,000 mg by mouth 2 (two) times daily with a meal.     No current facility-administered medications on file prior to visit.    ALLERGIES: No Known Allergies  FAMILY HISTORY: Family History  Problem Relation Age of Onset   Healthy Mother    Healthy Father     SOCIAL HISTORY: Social History   Socioeconomic History   Marital status: Legally Separated    Spouse name: Not on file   Number of children: Not on file   Years of education: Not on file   Highest education level: Not on file  Occupational History   Not on file  Tobacco Use   Smoking status: Former    Types: Cigarettes   Smokeless tobacco: Never  Substance and Sexual Activity   Alcohol use: Not Currently   Drug use: Never   Sexual activity: Not Currently    Birth control/protection: None  Other Topics Concern   Not on file  Social History Narrative   Not on file   Social Determinants of Health   Financial Resource Strain: Not on file  Food Insecurity: Not on file  Transportation Needs: Not on file  Physical Activity: Not on file  Stress: Not on file  Social Connections: Not on file  Intimate Partner Violence: Not on file     PHYSICAL EXAM: Vitals:   08/15/21 1304  BP: 130/78  Pulse: 89  SpO2: 99%   General: No acute distress Head:  Normocephalic/atraumatic Skin/Extremities: No rash, no edema Neurological Exam: Mental status: alert and oriented to person, place, and time, no dysarthria or aphasia, Fund of knowledge is appropriate.  Recent and remote memory are intact, 2/3 delayed recall.  Attention and concentration  are normal, 5/5 WORLD backward.  Able to name objects and repeat phrases. Cranial nerves: CN I: not tested CN II: pupils equal, round and reactive to light, visual fields intact CN III, IV, VI:  full range of motion, no nystagmus, no ptosis CN V: facial sensation intact CN VII: upper and lower face symmetric CN VIII: hearing intact to conversation Bulk & Tone: normal, no fasciculations. Motor: 5/5 throughout with no pronator drift. Sensation: intact to light touch, cold, pin, vibration and joint position sense.  No extinction to double simultaneous stimulation.  Romberg test negative Deep Tendon Reflexes: +1 throughout Cerebellar: no incoordination on finger to nose testing Gait: narrow-based and steady, no ataxia Tremor: none Negative Tinel sign at the wrist and elbow   IMPRESSION: This is a 70 year old right-handed woman with a history of hypertension, hyperlipidemia, diabetes, presenting for evaluation of a transient episode of loss of awareness last 08/11/2021. 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 Seizure is still a possibility. We discussed doing a 24-hour EEG to assess for focal abnormalities that increase risk for recurrent seizures. Continue Plavix for 90 days and aspirin 325mg  daily. She is concerned as well about bilateral numbness and weakness in her hands, EMG/NCV of both upper extremities will be ordered to evaluate symptoms. [place] driving laws were discussed with the patient, and she knows to stop driving after an episode of loss of awareness until 6 months event-free. Follow-up after tests, call for any changes.   Thank you for allowing me to participate in the care of this patient. Please do not hesitate to call for  any questions or concerns.   Ellouise Newer, M.D.  CC: Dr. Erlinda Hong, Arthur Holms, NP

## 2021-08-20 DIAGNOSIS — M50222 Other cervical disc displacement at C5-C6 level: Secondary | ICD-10-CM | POA: Diagnosis not present

## 2021-08-20 DIAGNOSIS — M9902 Segmental and somatic dysfunction of thoracic region: Secondary | ICD-10-CM | POA: Diagnosis not present

## 2021-08-20 DIAGNOSIS — M5136 Other intervertebral disc degeneration, lumbar region: Secondary | ICD-10-CM | POA: Diagnosis not present

## 2021-08-20 DIAGNOSIS — M5459 Other low back pain: Secondary | ICD-10-CM | POA: Diagnosis not present

## 2021-08-20 DIAGNOSIS — M9901 Segmental and somatic dysfunction of cervical region: Secondary | ICD-10-CM | POA: Diagnosis not present

## 2021-08-20 DIAGNOSIS — M791 Myalgia, unspecified site: Secondary | ICD-10-CM | POA: Diagnosis not present

## 2021-08-20 DIAGNOSIS — M546 Pain in thoracic spine: Secondary | ICD-10-CM | POA: Diagnosis not present

## 2021-08-20 DIAGNOSIS — R293 Abnormal posture: Secondary | ICD-10-CM | POA: Diagnosis not present

## 2021-08-20 DIAGNOSIS — M542 Cervicalgia: Secondary | ICD-10-CM | POA: Diagnosis not present

## 2021-08-20 DIAGNOSIS — M9903 Segmental and somatic dysfunction of lumbar region: Secondary | ICD-10-CM | POA: Diagnosis not present

## 2021-08-26 ENCOUNTER — Ambulatory Visit: Payer: Federal, State, Local not specified - PPO | Admitting: Neurology

## 2021-08-26 DIAGNOSIS — Z0000 Encounter for general adult medical examination without abnormal findings: Secondary | ICD-10-CM | POA: Diagnosis not present

## 2021-08-26 DIAGNOSIS — Z008 Encounter for other general examination: Secondary | ICD-10-CM | POA: Diagnosis not present

## 2021-08-26 DIAGNOSIS — Z79899 Other long term (current) drug therapy: Secondary | ICD-10-CM | POA: Diagnosis not present

## 2021-08-26 DIAGNOSIS — I10 Essential (primary) hypertension: Secondary | ICD-10-CM | POA: Diagnosis not present

## 2021-08-26 DIAGNOSIS — Z7189 Other specified counseling: Secondary | ICD-10-CM | POA: Diagnosis not present

## 2021-08-26 DIAGNOSIS — E1165 Type 2 diabetes mellitus with hyperglycemia: Secondary | ICD-10-CM | POA: Diagnosis not present

## 2021-08-26 DIAGNOSIS — Z794 Long term (current) use of insulin: Secondary | ICD-10-CM | POA: Diagnosis not present

## 2021-08-27 DIAGNOSIS — M791 Myalgia, unspecified site: Secondary | ICD-10-CM | POA: Diagnosis not present

## 2021-08-27 DIAGNOSIS — M9902 Segmental and somatic dysfunction of thoracic region: Secondary | ICD-10-CM | POA: Diagnosis not present

## 2021-08-27 DIAGNOSIS — M546 Pain in thoracic spine: Secondary | ICD-10-CM | POA: Diagnosis not present

## 2021-08-27 DIAGNOSIS — M5459 Other low back pain: Secondary | ICD-10-CM | POA: Diagnosis not present

## 2021-08-27 DIAGNOSIS — M542 Cervicalgia: Secondary | ICD-10-CM | POA: Diagnosis not present

## 2021-08-27 DIAGNOSIS — M5136 Other intervertebral disc degeneration, lumbar region: Secondary | ICD-10-CM | POA: Diagnosis not present

## 2021-08-27 DIAGNOSIS — M50222 Other cervical disc displacement at C5-C6 level: Secondary | ICD-10-CM | POA: Diagnosis not present

## 2021-08-27 DIAGNOSIS — R293 Abnormal posture: Secondary | ICD-10-CM | POA: Diagnosis not present

## 2021-08-27 DIAGNOSIS — M9903 Segmental and somatic dysfunction of lumbar region: Secondary | ICD-10-CM | POA: Diagnosis not present

## 2021-08-27 DIAGNOSIS — M9901 Segmental and somatic dysfunction of cervical region: Secondary | ICD-10-CM | POA: Diagnosis not present

## 2021-09-03 DIAGNOSIS — M546 Pain in thoracic spine: Secondary | ICD-10-CM | POA: Diagnosis not present

## 2021-09-03 DIAGNOSIS — M9901 Segmental and somatic dysfunction of cervical region: Secondary | ICD-10-CM | POA: Diagnosis not present

## 2021-09-03 DIAGNOSIS — M5459 Other low back pain: Secondary | ICD-10-CM | POA: Diagnosis not present

## 2021-09-03 DIAGNOSIS — R293 Abnormal posture: Secondary | ICD-10-CM | POA: Diagnosis not present

## 2021-09-03 DIAGNOSIS — M9903 Segmental and somatic dysfunction of lumbar region: Secondary | ICD-10-CM | POA: Diagnosis not present

## 2021-09-03 DIAGNOSIS — M791 Myalgia, unspecified site: Secondary | ICD-10-CM | POA: Diagnosis not present

## 2021-09-03 DIAGNOSIS — M5136 Other intervertebral disc degeneration, lumbar region: Secondary | ICD-10-CM | POA: Diagnosis not present

## 2021-09-03 DIAGNOSIS — M9902 Segmental and somatic dysfunction of thoracic region: Secondary | ICD-10-CM | POA: Diagnosis not present

## 2021-09-03 DIAGNOSIS — M542 Cervicalgia: Secondary | ICD-10-CM | POA: Diagnosis not present

## 2021-09-03 DIAGNOSIS — M50222 Other cervical disc displacement at C5-C6 level: Secondary | ICD-10-CM | POA: Diagnosis not present

## 2021-09-04 ENCOUNTER — Other Ambulatory Visit: Payer: Self-pay | Admitting: Internal Medicine

## 2021-09-10 ENCOUNTER — Other Ambulatory Visit: Payer: Self-pay

## 2021-09-10 ENCOUNTER — Ambulatory Visit (INDEPENDENT_AMBULATORY_CARE_PROVIDER_SITE_OTHER): Payer: Medicare Other | Admitting: Neurology

## 2021-09-10 DIAGNOSIS — M5459 Other low back pain: Secondary | ICD-10-CM | POA: Diagnosis not present

## 2021-09-10 DIAGNOSIS — M9903 Segmental and somatic dysfunction of lumbar region: Secondary | ICD-10-CM | POA: Diagnosis not present

## 2021-09-10 DIAGNOSIS — R293 Abnormal posture: Secondary | ICD-10-CM | POA: Diagnosis not present

## 2021-09-10 DIAGNOSIS — M50222 Other cervical disc displacement at C5-C6 level: Secondary | ICD-10-CM | POA: Diagnosis not present

## 2021-09-10 DIAGNOSIS — M546 Pain in thoracic spine: Secondary | ICD-10-CM | POA: Diagnosis not present

## 2021-09-10 DIAGNOSIS — M9901 Segmental and somatic dysfunction of cervical region: Secondary | ICD-10-CM | POA: Diagnosis not present

## 2021-09-10 DIAGNOSIS — M5136 Other intervertebral disc degeneration, lumbar region: Secondary | ICD-10-CM | POA: Diagnosis not present

## 2021-09-10 DIAGNOSIS — M542 Cervicalgia: Secondary | ICD-10-CM | POA: Diagnosis not present

## 2021-09-10 DIAGNOSIS — M9902 Segmental and somatic dysfunction of thoracic region: Secondary | ICD-10-CM | POA: Diagnosis not present

## 2021-09-10 DIAGNOSIS — R200 Anesthesia of skin: Secondary | ICD-10-CM

## 2021-09-10 DIAGNOSIS — M791 Myalgia, unspecified site: Secondary | ICD-10-CM | POA: Diagnosis not present

## 2021-09-10 DIAGNOSIS — G5603 Carpal tunnel syndrome, bilateral upper limbs: Secondary | ICD-10-CM

## 2021-09-10 NOTE — Procedures (Signed)
[facility] Neu[facility]  [address]  [place], [place] [address] Tel: [phone] Fax:  [phone] Test Date:  09/10/2021  Patient: Hannah Miller DOB: 12/02/1950 Physician: Narda Amber, DO  Sex: Female Height: 5\' 6"  Ref Phys: Ellouise Newer, M.D.  ID#: [id number]   Technician:    Patient Complaints: This is a 70 year old female referred for evaluation of bilateral hand numbness and tingling.  NCV & EMG Findings: Extensive electrodiagnostic testing of the right upper extremity and additional studies of the left shows:  Bilateral median sensory responses are absent.  Bilateral ulnar sensory responses are within normal limits. Right median motor response shows severely prolonged latency (12.9 ms) and reduced amplitude (2.1 mV).  Left median motor response shows severely prolonged latency (9.4 ms) and borderline-normal amplitude.  There is evidence of bilateral Martin-Gruber anastomoses, a normal anatomic variant.  Bilateral ulnar motor responses are within normal limits.  Chronic motor axonal loss changes are seen affecting the abductor pollicis brevis muscles bilaterally, which is severe on the right.   Impression: Bilateral median neuropathy at or distal to the wrist, consistent with a clinical diagnosis of carpal tunnel syndrome.  Overall, these findings are severe in degree electrically and worse on the right.   ___________________________ Narda Amber, DO    Nerve Conduction Studies Anti Sensory Summary Table   Stim Site NR Peak (ms) Norm Peak (ms) P-T Amp (V) Norm P-T Amp  Left Median Anti Sensory (2nd Digit)  32C  Wrist NR  <3.8  >10  Right Median Anti Sensory (2nd Digit)  32C  Wrist NR  <3.8  >10  Left Ulnar Anti Sensory (5th Digit)  32C  Wrist    3.0 <3.2 25.0 >5  Right Ulnar Anti Sensory (5th Digit)  32C  Wrist    3.0 <3.2 31.5 >5   Motor Summary Table   Stim Site NR Onset (ms) Norm Onset (ms) O-P Amp (mV) Norm O-P Amp Site1 Site2 Delta-0 (ms)  Dist (cm) Vel (m/s) Norm Vel (m/s)  Left Median Motor (Abd Poll Brev)  32C  Wrist    9.4 <4.0 5.5 >5 Elbow Wrist 4.7 28.0 60 >50  Elbow    14.1  5.5  Ulnar-wristcrossover Elbow 9.9 0.0    Ulnar-wristcrossover    4.2  2.8         Right Median Motor (Abd Poll Brev)  32C  Wrist    12.9 <4.0 2.1 >5 Elbow Wrist 3.6 25.0 69 >50  Elbow    16.5  2.0  Ulnar-wrist crossover Elbow 12.4 0.0    Ulnar-wrist crossover    4.1  3.4         Left Ulnar Motor (Abd Dig Minimi)  32C  Wrist    2.3 <3.1 9.9 >7 B Elbow Wrist 3.9 21.0 54 >50  B Elbow    6.2  9.3  A Elbow B Elbow 1.7 10.0 59 >50  A Elbow    7.9  9.1         Right Ulnar Motor (Abd Dig Minimi)  32C  Wrist    2.4 <3.1 9.3 >7 B Elbow Wrist 3.9 22.0 56 >50  B Elbow    6.3  9.0  A Elbow B Elbow 1.8 10.0 56 >50  A Elbow    8.1  8.6          EMG   Side Muscle Ins Act Fibs Psw Fasc Number Recrt Dur Dur. Amp Amp. Poly Poly. Comment  Right 1stDorInt Nml Nml Nml  Nml Nml Nml Nml Nml Nml Nml Nml Nml N/A  Right Abd Poll Brev Nml Nml Nml Nml SMU Rapid All 1+ Most 1+ All 1+ ATR  Right PronatorTeres Nml Nml Nml Nml Nml Nml Nml Nml Nml Nml Nml Nml N/A  Right Biceps Nml Nml Nml Nml Nml Nml Nml Nml Nml Nml Nml Nml N/A  Right Triceps Nml Nml Nml Nml Nml Nml Nml Nml Nml Nml Nml Nml N/A  Right Deltoid Nml Nml Nml Nml Nml Nml Nml Nml Nml Nml Nml Nml N/A  Left 1stDorInt Nml Nml Nml Nml Nml Nml Nml Nml Nml Nml Nml Nml N/A  Left Abd Poll Brev Nml Nml Nml Nml 1- Rapid Some 1+ Some 1+ Some 1+ N/A  Left PronatorTeres Nml Nml Nml Nml Nml Nml Nml Nml Nml Nml Nml Nml N/A  Left Biceps Nml Nml Nml Nml Nml Nml Nml Nml Nml Nml Nml Nml N/A  Left Triceps Nml Nml Nml Nml Nml Nml Nml Nml Nml Nml Nml Nml N/A  Left Deltoid Nml Nml Nml Nml Nml Nml Nml Nml Nml Nml Nml Nml N/A      Waveforms:

## 2021-09-13 ENCOUNTER — Telehealth: Payer: Self-pay | Admitting: Neurology

## 2021-09-13 DIAGNOSIS — G5603 Carpal tunnel syndrome, bilateral upper limbs: Secondary | ICD-10-CM

## 2021-09-13 NOTE — Telephone Encounter (Signed)
Pt called and informed that the nerve test showed severe carpal tunnel syndrome (pressure on the nerve at the wrist) on both hands, right more than left. When we see these changes, we refer patients to the hand surgeon

## 2021-09-13 NOTE — Telephone Encounter (Signed)
-----   Message from Cameron Sprang, MD sent at 09/10/2021  4:27 PM EDT ----- Pls let her know that the nerve test showed severe carpal tunnel syndrome (pressure on the nerve at the wrist) on both hands, right more than left. When we see these changes, we refer patients to the hand surgeon. If she is okay with that, pls send referral for bilateral carpal tunnel syndrome, thanks

## 2021-09-13 NOTE — Telephone Encounter (Signed)
Pt left message returning our call about test results

## 2021-09-24 DIAGNOSIS — M9903 Segmental and somatic dysfunction of lumbar region: Secondary | ICD-10-CM | POA: Diagnosis not present

## 2021-09-24 DIAGNOSIS — M542 Cervicalgia: Secondary | ICD-10-CM | POA: Diagnosis not present

## 2021-09-24 DIAGNOSIS — R293 Abnormal posture: Secondary | ICD-10-CM | POA: Diagnosis not present

## 2021-09-24 DIAGNOSIS — M5136 Other intervertebral disc degeneration, lumbar region: Secondary | ICD-10-CM | POA: Diagnosis not present

## 2021-09-24 DIAGNOSIS — M50222 Other cervical disc displacement at C5-C6 level: Secondary | ICD-10-CM | POA: Diagnosis not present

## 2021-09-24 DIAGNOSIS — M9901 Segmental and somatic dysfunction of cervical region: Secondary | ICD-10-CM | POA: Diagnosis not present

## 2021-09-24 DIAGNOSIS — M546 Pain in thoracic spine: Secondary | ICD-10-CM | POA: Diagnosis not present

## 2021-09-24 DIAGNOSIS — M791 Myalgia, unspecified site: Secondary | ICD-10-CM | POA: Diagnosis not present

## 2021-09-24 DIAGNOSIS — M9902 Segmental and somatic dysfunction of thoracic region: Secondary | ICD-10-CM | POA: Diagnosis not present

## 2021-09-24 DIAGNOSIS — M5459 Other low back pain: Secondary | ICD-10-CM | POA: Diagnosis not present

## 2021-10-01 DIAGNOSIS — G5603 Carpal tunnel syndrome, bilateral upper limbs: Secondary | ICD-10-CM | POA: Diagnosis not present

## 2021-10-11 DIAGNOSIS — G5601 Carpal tunnel syndrome, right upper limb: Secondary | ICD-10-CM | POA: Diagnosis not present

## 2021-10-22 DIAGNOSIS — Z6838 Body mass index (BMI) 38.0-38.9, adult: Secondary | ICD-10-CM | POA: Diagnosis not present

## 2021-10-22 DIAGNOSIS — K8020 Calculus of gallbladder without cholecystitis without obstruction: Secondary | ICD-10-CM | POA: Diagnosis not present

## 2021-10-22 DIAGNOSIS — Z0000 Encounter for general adult medical examination without abnormal findings: Secondary | ICD-10-CM | POA: Diagnosis not present

## 2021-10-22 DIAGNOSIS — E559 Vitamin D deficiency, unspecified: Secondary | ICD-10-CM | POA: Diagnosis not present

## 2021-10-22 DIAGNOSIS — E1165 Type 2 diabetes mellitus with hyperglycemia: Secondary | ICD-10-CM | POA: Diagnosis not present

## 2021-10-22 DIAGNOSIS — E785 Hyperlipidemia, unspecified: Secondary | ICD-10-CM | POA: Diagnosis not present

## 2021-10-22 DIAGNOSIS — Z794 Long term (current) use of insulin: Secondary | ICD-10-CM | POA: Diagnosis not present

## 2021-10-22 DIAGNOSIS — Z008 Encounter for other general examination: Secondary | ICD-10-CM | POA: Diagnosis not present

## 2021-10-22 DIAGNOSIS — I10 Essential (primary) hypertension: Secondary | ICD-10-CM | POA: Diagnosis not present

## 2021-12-20 DIAGNOSIS — G5602 Carpal tunnel syndrome, left upper limb: Secondary | ICD-10-CM | POA: Diagnosis not present

## 2022-02-25 DIAGNOSIS — R002 Palpitations: Secondary | ICD-10-CM | POA: Diagnosis not present

## 2022-02-25 DIAGNOSIS — I10 Essential (primary) hypertension: Secondary | ICD-10-CM | POA: Diagnosis not present

## 2022-02-25 DIAGNOSIS — E559 Vitamin D deficiency, unspecified: Secondary | ICD-10-CM | POA: Diagnosis not present

## 2022-02-25 DIAGNOSIS — R0609 Other forms of dyspnea: Secondary | ICD-10-CM | POA: Diagnosis not present

## 2022-02-25 DIAGNOSIS — Z6832 Body mass index (BMI) 32.0-32.9, adult: Secondary | ICD-10-CM | POA: Diagnosis not present

## 2022-02-25 DIAGNOSIS — E1165 Type 2 diabetes mellitus with hyperglycemia: Secondary | ICD-10-CM | POA: Diagnosis not present

## 2022-02-25 DIAGNOSIS — E669 Obesity, unspecified: Secondary | ICD-10-CM | POA: Diagnosis not present

## 2022-02-25 DIAGNOSIS — Z794 Long term (current) use of insulin: Secondary | ICD-10-CM | POA: Diagnosis not present

## 2022-02-26 ENCOUNTER — Other Ambulatory Visit: Payer: Self-pay | Admitting: Registered Nurse

## 2022-02-26 ENCOUNTER — Ambulatory Visit
Admission: RE | Admit: 2022-02-26 | Discharge: 2022-02-26 | Disposition: A | Discharge status: Home / Self Care | Payer: Medicare Other | Source: Ambulatory Visit | Attending: Registered Nurse | Admitting: Registered Nurse

## 2022-02-26 DIAGNOSIS — I10 Essential (primary) hypertension: Secondary | ICD-10-CM | POA: Diagnosis not present

## 2022-02-26 DIAGNOSIS — R0600 Dyspnea, unspecified: Secondary | ICD-10-CM | POA: Diagnosis not present

## 2022-02-26 DIAGNOSIS — R0609 Other forms of dyspnea: Secondary | ICD-10-CM

## 2022-02-28 ENCOUNTER — Other Ambulatory Visit: Payer: Self-pay | Admitting: Registered Nurse

## 2022-02-28 ENCOUNTER — Other Ambulatory Visit (HOSPITAL_COMMUNITY): Payer: Self-pay | Admitting: Registered Nurse

## 2022-02-28 DIAGNOSIS — R0609 Other forms of dyspnea: Secondary | ICD-10-CM

## 2022-02-28 DIAGNOSIS — R0600 Dyspnea, unspecified: Secondary | ICD-10-CM

## 2022-03-03 ENCOUNTER — Other Ambulatory Visit: Payer: Federal, State, Local not specified - PPO

## 2022-03-03 ENCOUNTER — Inpatient Hospital Stay: Admission: RE | Admit: 2022-03-03 | Payer: Federal, State, Local not specified - PPO | Source: Ambulatory Visit

## 2022-03-04 ENCOUNTER — Ambulatory Visit
Admission: RE | Admit: 2022-03-04 | Discharge: 2022-03-04 | Disposition: A | Discharge status: Home / Self Care | Payer: Medicare Other | Source: Ambulatory Visit | Attending: Registered Nurse | Admitting: Registered Nurse

## 2022-03-04 ENCOUNTER — Other Ambulatory Visit: Payer: Federal, State, Local not specified - PPO

## 2022-03-04 DIAGNOSIS — K753 Granulomatous hepatitis, not elsewhere classified: Secondary | ICD-10-CM | POA: Diagnosis not present

## 2022-03-04 DIAGNOSIS — Z79899 Other long term (current) drug therapy: Secondary | ICD-10-CM | POA: Diagnosis not present

## 2022-03-04 DIAGNOSIS — R5383 Other fatigue: Secondary | ICD-10-CM | POA: Diagnosis not present

## 2022-03-04 DIAGNOSIS — J9859 Other diseases of mediastinum, not elsewhere classified: Secondary | ICD-10-CM | POA: Diagnosis not present

## 2022-03-04 DIAGNOSIS — D649 Anemia, unspecified: Secondary | ICD-10-CM | POA: Diagnosis not present

## 2022-03-04 DIAGNOSIS — R0609 Other forms of dyspnea: Secondary | ICD-10-CM | POA: Diagnosis not present

## 2022-03-04 MED ORDER — IOPAMIDOL (ISOVUE-370) INJECTION 76%
75.0000 mL | Freq: Once | INTRAVENOUS | Status: AC | PRN
  Administered 2022-03-04: 75 mL via INTRAVENOUS

## 2022-03-10 DIAGNOSIS — Z1211 Encounter for screening for malignant neoplasm of colon: Secondary | ICD-10-CM | POA: Diagnosis not present

## 2022-03-13 ENCOUNTER — Ambulatory Visit (HOSPITAL_COMMUNITY)
Admission: RE | Admit: 2022-03-13 | Discharge: 2022-03-13 | Disposition: A | Discharge status: Home / Self Care | Payer: Medicare Other | Source: Ambulatory Visit | Attending: Registered Nurse | Admitting: Registered Nurse

## 2022-03-13 DIAGNOSIS — R0609 Other forms of dyspnea: Secondary | ICD-10-CM

## 2022-03-13 DIAGNOSIS — E119 Type 2 diabetes mellitus without complications: Secondary | ICD-10-CM | POA: Insufficient documentation

## 2022-03-13 DIAGNOSIS — R0600 Dyspnea, unspecified: Secondary | ICD-10-CM

## 2022-03-13 DIAGNOSIS — I517 Cardiomegaly: Secondary | ICD-10-CM | POA: Insufficient documentation

## 2022-03-13 LAB — ECHOCARDIOGRAM COMPLETE
Area-P 1/2: 4.06 cm2
Calc EF: 64.2 %
S' Lateral: 3 cm
Single Plane A2C EF: 58.2 %
Single Plane A4C EF: 68.2 %

## 2022-03-16 ENCOUNTER — Emergency Department (HOSPITAL_COMMUNITY): Payer: Medicare Other

## 2022-03-16 ENCOUNTER — Emergency Department (HOSPITAL_COMMUNITY)
Admission: EM | Admit: 2022-03-16 | Discharge: 2022-03-16 | Disposition: A | Discharge status: Home / Self Care | Payer: Medicare Other | Attending: Emergency Medicine | Admitting: Emergency Medicine

## 2022-03-16 ENCOUNTER — Encounter (HOSPITAL_COMMUNITY): Payer: Self-pay | Admitting: Emergency Medicine

## 2022-03-16 DIAGNOSIS — R112 Nausea with vomiting, unspecified: Secondary | ICD-10-CM | POA: Diagnosis not present

## 2022-03-16 DIAGNOSIS — Z87891 Personal history of nicotine dependence: Secondary | ICD-10-CM | POA: Diagnosis not present

## 2022-03-16 DIAGNOSIS — Z7984 Long term (current) use of oral hypoglycemic drugs: Secondary | ICD-10-CM | POA: Insufficient documentation

## 2022-03-16 DIAGNOSIS — Z794 Long term (current) use of insulin: Secondary | ICD-10-CM | POA: Insufficient documentation

## 2022-03-16 DIAGNOSIS — E1165 Type 2 diabetes mellitus with hyperglycemia: Secondary | ICD-10-CM | POA: Insufficient documentation

## 2022-03-16 DIAGNOSIS — Z7982 Long term (current) use of aspirin: Secondary | ICD-10-CM | POA: Diagnosis not present

## 2022-03-16 DIAGNOSIS — R7989 Other specified abnormal findings of blood chemistry: Secondary | ICD-10-CM | POA: Insufficient documentation

## 2022-03-16 DIAGNOSIS — N281 Cyst of kidney, acquired: Secondary | ICD-10-CM | POA: Diagnosis not present

## 2022-03-16 DIAGNOSIS — R0602 Shortness of breath: Secondary | ICD-10-CM | POA: Insufficient documentation

## 2022-03-16 DIAGNOSIS — K8020 Calculus of gallbladder without cholecystitis without obstruction: Secondary | ICD-10-CM

## 2022-03-16 DIAGNOSIS — Z7902 Long term (current) use of antithrombotics/antiplatelets: Secondary | ICD-10-CM | POA: Diagnosis not present

## 2022-03-16 DIAGNOSIS — R5383 Other fatigue: Secondary | ICD-10-CM | POA: Diagnosis not present

## 2022-03-16 DIAGNOSIS — R0609 Other forms of dyspnea: Secondary | ICD-10-CM | POA: Diagnosis not present

## 2022-03-16 DIAGNOSIS — R4182 Altered mental status, unspecified: Secondary | ICD-10-CM | POA: Diagnosis not present

## 2022-03-16 DIAGNOSIS — R110 Nausea: Secondary | ICD-10-CM

## 2022-03-16 DIAGNOSIS — R0600 Dyspnea, unspecified: Secondary | ICD-10-CM

## 2022-03-16 LAB — COMPREHENSIVE METABOLIC PANEL
ALT: 27 U/L (ref 0–44)
AST: 38 U/L (ref 15–41)
Albumin: 4.1 g/dL (ref 3.5–5.0)
Alkaline Phosphatase: 38 U/L (ref 38–126)
Anion gap: 9 (ref 5–15)
BUN: 14 mg/dL (ref 8–23)
CO2: 30 mmol/L (ref 22–32)
Calcium: 13.3 mg/dL (ref 8.9–10.3)
Chloride: 102 mmol/L (ref 98–111)
Creatinine, Ser: 1.18 mg/dL — ABNORMAL HIGH (ref 0.44–1.00)
GFR, Estimated: 50 mL/min — ABNORMAL LOW (ref >60)
Glucose, Bld: 199 mg/dL — ABNORMAL HIGH (ref 70–99)
Potassium: 3.8 mmol/L (ref 3.5–5.1)
Sodium: 141 mmol/L (ref 135–145)
Total Bilirubin: 1.2 mg/dL (ref 0.3–1.2)
Total Protein: 8.7 g/dL — ABNORMAL HIGH (ref 6.5–8.1)

## 2022-03-16 LAB — CBC WITH DIFFERENTIAL/PLATELET
Abs Immature Granulocytes: 0.26 10*3/uL — ABNORMAL HIGH (ref 0.00–0.07)
Basophils Absolute: 0.1 10*3/uL (ref 0.0–0.1)
Basophils Relative: 1 %
Eosinophils Absolute: 0.1 10*3/uL (ref 0.0–0.5)
Eosinophils Relative: 2 %
HCT: 33.1 % — ABNORMAL LOW (ref 36.0–46.0)
Hemoglobin: 10.6 g/dL — ABNORMAL LOW (ref 12.0–15.0)
Immature Granulocytes: 3 %
Lymphocytes Relative: 38 %
Lymphs Abs: 3.1 10*3/uL (ref 0.7–4.0)
MCH: 26.6 pg (ref 26.0–34.0)
MCHC: 32 g/dL (ref 30.0–36.0)
MCV: 83.2 fL (ref 80.0–100.0)
Monocytes Absolute: 1.1 10*3/uL — ABNORMAL HIGH (ref 0.1–1.0)
Monocytes Relative: 14 %
Neutro Abs: 3.5 10*3/uL (ref 1.7–7.7)
Neutrophils Relative %: 42 %
Platelets: 105 10*3/uL — ABNORMAL LOW (ref 150–400)
RBC: 3.98 MIL/uL (ref 3.87–5.11)
RDW: 19.4 % — ABNORMAL HIGH (ref 11.5–15.5)
WBC: 8.1 10*3/uL (ref 4.0–10.5)
nRBC: 5.9 % — ABNORMAL HIGH (ref 0.0–0.2)

## 2022-03-16 LAB — LIPASE, BLOOD: Lipase: 25 U/L (ref 11–51)

## 2022-03-16 LAB — TROPONIN I (HIGH SENSITIVITY)
Troponin I (High Sensitivity): 43 ng/L — ABNORMAL HIGH (ref <18)
Troponin I (High Sensitivity): 45 ng/L — ABNORMAL HIGH (ref <18)

## 2022-03-16 MED ORDER — ONDANSETRON 4 MG PO TBDP: 4.0000 mg | ORAL_TABLET | Freq: Three times a day (TID) | ORAL | 0 refills | Status: AC | PRN

## 2022-03-16 MED ORDER — SODIUM CHLORIDE 0.9 % IV BOLUS
1000.0000 mL | Freq: Once | INTRAVENOUS | Status: AC
  Administered 2022-03-16: 1000 mL via INTRAVENOUS

## 2022-03-16 MED ORDER — SODIUM CHLORIDE (PF) 0.9 % IJ SOLN
INTRAMUSCULAR | Status: AC
  Filled 2022-03-16: qty 50

## 2022-03-16 MED ORDER — IOHEXOL 300 MG/ML  SOLN
100.0000 mL | Freq: Once | INTRAMUSCULAR | Status: AC | PRN
  Administered 2022-03-16: 100 mL via INTRAVENOUS

## 2022-03-16 MED ORDER — SODIUM CHLORIDE 0.9 % IV SOLN
INTRAVENOUS | Status: DC
Start: 2022-03-16 — End: 2022-03-16

## 2022-03-16 MED ORDER — ONDANSETRON HCL 4 MG/2ML IJ SOLN
4.0000 mg | Freq: Once | INTRAMUSCULAR | Status: AC
  Administered 2022-03-16: 4 mg via INTRAVENOUS
  Filled 2022-03-16: qty 2

## 2022-03-16 NOTE — Discharge Instructions (Addendum)
Hold the calcium supplement.  Keep your appointment with your primary care doctor for Tuesday.  I printed out your labs and results from here.  We did see elevated calcium.  Could be due to the supplement.  Or could be another issue.  If there is concern for another issue then additional work-up for the elevated calcium would be important.  You were able to ambulate here without a drop in your oxygen sats.  Which is very reassuring.  Take the Zofran as needed for the nausea and vomiting. ?

## 2022-03-16 NOTE — ED Triage Notes (Addendum)
Patient reports weakness and nausea since 4/5. Reports increase fatigue and SOB on exertion. Holter monitor in place from PCP. Denies pain. ?

## 2022-03-16 NOTE — ED Notes (Signed)
Pt reports feeling weak and unable to ambulate at this time ?

## 2022-03-16 NOTE — ED Provider Notes (Addendum)
?[place] DEPT ?Provider Note ? ? ?CSN: [id number] ?Arrival date & time: 03/16/22  7616 ? ?  ? ?History ? ?Chief Complaint  ?Patient presents with  ? Weakness  ? Nausea  ? ? ?Hannah Miller is a 71 y.o. female. ? ?Patient followed by Arthur Holms nurse practitioner.  Patient with a complaint of fatigue shortness of breath nausea since April 5.  Her primary care doctor has done extensive blood work that is unfortunately we cannot see.  She is also had an echocardiogram done chest x-ray and CTA of chest.  Patient without any symptoms at rest other than persistent nausea.  Says she has had decreased p.o. intake.  Vital signs on arrival here temp 97.5 heart rate 95 respiration 17 blood pressure 142/62 oxygen sats 97%.  Patient denies any chest pain has had 1 episode of vomiting with the nausea.  The nausea has been persistent.  No diarrhea.  No abdominal pain.  No fevers no dysuria no rash.  The shortness of breath is with exertion. ? ?Past medical history sniffing for diabetes without complications.  Was a former smoker of cigarettes.  Patient is on Plavix. ? ? ?  ? ?Home Medications ?Prior to Admission medications   ?Medication Sig Start Date End Date Taking? Authorizing Provider  ?aspirin EC 325 MG EC tablet Take 1 tablet (325 mg total) by mouth daily. 08/13/21   Demaio, Alexa, MD  ?atorvastatin (LIPITOR) 40 MG tablet Take 40 mg by mouth daily.    [provider]  ?cholecalciferol (VITAMIN D3) 25 MCG (1000 UNIT) tablet Take 1,000 Units by mouth daily.    [provider]  ?clopidogrel (PLAVIX) 75 MG tablet Take 1 tablet (75 mg total) by mouth daily. 08/15/21   Cameron Sprang, MD  ?insulin lispro protamine-insulin lispro (HUMALOG 75/25) (75-25) 100 UNIT/ML SUSP Inject 50 Units into the skin in the morning, at noon, and at bedtime.    [provider]  ?lisinopril (PRINIVIL,ZESTRIL) 5 MG tablet Take 5 mg by mouth daily.    [provider]  ?metFORMIN  (GLUCOPHAGE) 1000 MG tablet Take 1,000 mg by mouth 2 (two) times daily with a meal.  07/30/20  [provider]  ?   ? ?Allergies    ?Patient has no known allergies.   ? ?Review of Systems   ?Review of Systems  ?Constitutional:  Positive for fatigue. Negative for chills and fever.  ?HENT:  Negative for ear pain and sore throat.   ?Eyes:  Negative for pain and visual disturbance.  ?Respiratory:  Positive for shortness of breath. Negative for cough.   ?Cardiovascular:  Negative for chest pain, palpitations and leg swelling.  ?Gastrointestinal:  Positive for nausea and vomiting. Negative for abdominal pain.  ?Genitourinary:  Negative for dysuria and hematuria.  ?Musculoskeletal:  Negative for arthralgias and back pain.  ?Skin:  Negative for color change and rash.  ?Neurological:  Negative for seizures and syncope.  ?All other systems reviewed and are negative. ? ?Physical Exam ?Updated Vital Signs ?BP (!) 165/76   Pulse 95   Temp (!) 97.5 ?F (36.4 ?C) (Oral)   Resp 20   SpO2 97%  ?Physical Exam ?Vitals and nursing note reviewed.  ?Constitutional:   ?   General: She is not in acute distress. ?   Appearance: Normal appearance. She is well-developed. She is not ill-appearing, toxic-appearing or diaphoretic.  ?HENT:  ?   Head: Normocephalic and atraumatic.  ?   Mouth/Throat:  ?   Comments:  Mucous membranes slightly dry.  But still has some moisture in the mouth ?Eyes:  ?   Extraocular Movements: Extraocular movements intact.  ?   Conjunctiva/sclera: Conjunctivae normal.  ?   Pupils: Pupils are equal, round, and reactive to light.  ?Cardiovascular:  ?   Rate and Rhythm: Normal rate and regular rhythm.  ?   Heart sounds: No murmur heard. ?Pulmonary:  ?   Effort: Pulmonary effort is normal. No respiratory distress.  ?   Breath sounds: Normal breath sounds. No wheezing, rhonchi or rales.  ?Abdominal:  ?   General: There is no distension.  ?   Palpations: Abdomen is soft.  ?   Tenderness: There is no abdominal  tenderness. There is no guarding.  ?Musculoskeletal:     ?   General: No swelling.  ?   Cervical back: Normal range of motion and neck supple. No rigidity.  ?   Right lower leg: No edema.  ?   Left lower leg: No edema.  ?Skin: ?   General: Skin is warm and dry.  ?   Capillary Refill: Capillary refill takes less than 2 seconds.  ?Neurological:  ?   Mental Status: She is alert and oriented to person, place, and time.  ?Psychiatric:     ?   Mood and Affect: Mood normal.  ? ? ?ED Results / Procedures / Treatments   ?Labs ?(all labs ordered are listed, but only abnormal results are displayed) ?Labs Reviewed  ?COMPREHENSIVE METABOLIC PANEL - Abnormal; Notable for the following components:  ?    Result Value  ? Glucose, Bld 199 (*)   ? Creatinine, Ser 1.18 (*)   ? Calcium 13.3 (*)   ? Total Protein 8.7 (*)   ? GFR, Estimated 50 (*)   ? All other components within normal limits  ?CBC WITH DIFFERENTIAL/PLATELET - Abnormal; Notable for the following components:  ? Hemoglobin 10.6 (*)   ? HCT 33.1 (*)   ? RDW 19.4 (*)   ? Platelets 105 (*)   ? nRBC 5.9 (*)   ? Monocytes Absolute 1.1 (*)   ? Abs Immature Granulocytes 0.26 (*)   ? All other components within normal limits  ?TROPONIN I (HIGH SENSITIVITY) - Abnormal; Notable for the following components:  ? Troponin I (High Sensitivity) 43 (*)   ? All other components within normal limits  ?TROPONIN I (HIGH SENSITIVITY) - Abnormal; Notable for the following components:  ? Troponin I (High Sensitivity) 45 (*)   ? All other components within normal limits  ?LIPASE, BLOOD  ? ? ?EKG ?EKG Interpretation ? ?Date/Time:  Sunday March 16 2022 10:09:28 EDT ?Ventricular Rate:  87 ?PR Interval:  114 ?QRS Duration: 76 ?QT Interval:  366 ?QTC Calculation: 441 ?R Axis:   11 ?Text Interpretation: Sinus rhythm Ventricular premature complex Borderline short PR interval Left ventricular hypertrophy Confirmed by Fredia Sorrow [phone]) on 03/16/2022 10:33:57 AM ? ?Radiology ?CT Head Wo  Contrast ? ?Result Date: 03/16/2022 ?CLINICAL DATA:  Mental status change. EXAM: CT HEAD WITHOUT CONTRAST TECHNIQUE: Contiguous axial images were obtained from the base of the skull through the vertex without intravenous contrast. RADIATION DOSE REDUCTION: This exam was performed according to the departmental dose-optimization program which includes automated exposure control, adjustment of the mA and/or kV according to patient size and/or use of iterative reconstruction technique. COMPARISON:  August 11, 2021 FINDINGS: Brain: No evidence of acute infarction, hemorrhage, hydrocephalus, extra-axial collection or mass lesion/mass effect. Vascular: No hyperdense vessel is noted. Skull:  Normal. Negative for fracture or focal lesion. Sinuses/Orbits: No acute finding. Other: None. IMPRESSION: No CT evidence of acute intracranial abnormality. Electronically Signed   By: Abelardo Diesel M.D.   On: 03/16/2022 12:28  ? ?CT Abdomen Pelvis W Contrast ? ?Result Date: 03/16/2022 ?CLINICAL DATA:  Nausea and vomiting since April 5th. EXAM: CT ABDOMEN AND PELVIS WITH CONTRAST TECHNIQUE: Multidetector CT imaging of the abdomen and pelvis was performed using the standard protocol following bolus administration of intravenous contrast. RADIATION DOSE REDUCTION: This exam was performed according to the departmental dose-optimization program which includes automated exposure control, adjustment of the mA and/or kV according to patient size and/or use of iterative reconstruction technique. CONTRAST:  125m OMNIPAQUE IOHEXOL 300 MG/ML  SOLN COMPARISON:  None. FINDINGS: Lower chest: Minimal bilateral pleural effusions are identified. The heart size is normal. Hepatobiliary: 1 mm calcified granuloma is identified in the dome of liver. No other focal liver lesions identified. Gallstones noted in the gallbladder. No inflammation is noted around gallbladder. The biliary tree is normal. Pancreas: Unremarkable. No pancreatic ductal dilatation or  surrounding inflammatory changes. Spleen: Low-density is identified in the posterior spleen question cysts. Adrenals/Urinary Tract: Adrenal glands are unremarkable. Kidneys are normal, without renal calculi, focal lesion, or hydronep

## 2022-03-28 DIAGNOSIS — M159 Polyosteoarthritis, unspecified: Secondary | ICD-10-CM | POA: Diagnosis not present

## 2022-03-28 DIAGNOSIS — D696 Thrombocytopenia, unspecified: Secondary | ICD-10-CM | POA: Diagnosis not present

## 2022-03-28 DIAGNOSIS — I10 Essential (primary) hypertension: Secondary | ICD-10-CM | POA: Diagnosis not present

## 2022-03-28 DIAGNOSIS — K148 Other diseases of tongue: Secondary | ICD-10-CM | POA: Diagnosis not present

## 2022-03-28 DIAGNOSIS — I509 Heart failure, unspecified: Secondary | ICD-10-CM | POA: Diagnosis not present

## 2022-03-28 DIAGNOSIS — E46 Unspecified protein-calorie malnutrition: Secondary | ICD-10-CM | POA: Diagnosis not present

## 2022-03-28 DIAGNOSIS — E538 Deficiency of other specified B group vitamins: Secondary | ICD-10-CM | POA: Diagnosis not present

## 2022-03-28 DIAGNOSIS — R2681 Unsteadiness on feet: Secondary | ICD-10-CM | POA: Diagnosis not present

## 2022-03-28 DIAGNOSIS — E11649 Type 2 diabetes mellitus with hypoglycemia without coma: Secondary | ICD-10-CM | POA: Diagnosis not present

## 2022-03-28 DIAGNOSIS — D649 Anemia, unspecified: Secondary | ICD-10-CM | POA: Diagnosis not present

## 2022-03-28 DIAGNOSIS — R5383 Other fatigue: Secondary | ICD-10-CM | POA: Diagnosis not present

## 2022-03-28 DIAGNOSIS — I110 Hypertensive heart disease with heart failure: Secondary | ICD-10-CM | POA: Diagnosis not present

## 2022-03-28 DIAGNOSIS — I700 Atherosclerosis of aorta: Secondary | ICD-10-CM | POA: Diagnosis not present

## 2022-03-28 DIAGNOSIS — E559 Vitamin D deficiency, unspecified: Secondary | ICD-10-CM | POA: Diagnosis not present

## 2022-03-31 DIAGNOSIS — K5900 Constipation, unspecified: Secondary | ICD-10-CM | POA: Diagnosis not present

## 2022-03-31 DIAGNOSIS — R5383 Other fatigue: Secondary | ICD-10-CM | POA: Diagnosis not present

## 2022-03-31 DIAGNOSIS — R110 Nausea: Secondary | ICD-10-CM | POA: Diagnosis not present

## 2022-04-02 ENCOUNTER — Other Ambulatory Visit: Payer: Self-pay | Admitting: Neurosurgery

## 2022-04-02 ENCOUNTER — Other Ambulatory Visit (HOSPITAL_COMMUNITY): Payer: Self-pay | Admitting: Neurosurgery

## 2022-04-02 DIAGNOSIS — Z6830 Body mass index (BMI) 30.0-30.9, adult: Secondary | ICD-10-CM | POA: Diagnosis not present

## 2022-04-02 DIAGNOSIS — M48062 Spinal stenosis, lumbar region with neurogenic claudication: Secondary | ICD-10-CM

## 2022-04-08 ENCOUNTER — Ambulatory Visit (HOSPITAL_COMMUNITY): Payer: Federal, State, Local not specified - PPO

## 2022-04-15 DIAGNOSIS — K148 Other diseases of tongue: Secondary | ICD-10-CM | POA: Diagnosis not present

## 2022-04-15 DIAGNOSIS — Z87891 Personal history of nicotine dependence: Secondary | ICD-10-CM | POA: Diagnosis not present

## 2022-04-15 DIAGNOSIS — J343 Hypertrophy of nasal turbinates: Secondary | ICD-10-CM | POA: Diagnosis not present

## 2022-04-16 ENCOUNTER — Ambulatory Visit (HOSPITAL_COMMUNITY)
Admission: RE | Admit: 2022-04-16 | Discharge: 2022-04-16 | Disposition: A | Discharge status: Home / Self Care | Payer: Medicare Other | Source: Ambulatory Visit | Attending: Neurosurgery | Admitting: Neurosurgery

## 2022-04-16 DIAGNOSIS — M48062 Spinal stenosis, lumbar region with neurogenic claudication: Secondary | ICD-10-CM | POA: Diagnosis not present

## 2022-04-16 DIAGNOSIS — M5126 Other intervertebral disc displacement, lumbar region: Secondary | ICD-10-CM | POA: Diagnosis not present

## 2022-04-17 ENCOUNTER — Other Ambulatory Visit (INDEPENDENT_AMBULATORY_CARE_PROVIDER_SITE_OTHER): Payer: Medicare Other

## 2022-04-17 ENCOUNTER — Encounter: Payer: Self-pay | Admitting: Neurology

## 2022-04-17 ENCOUNTER — Other Ambulatory Visit: Payer: Self-pay

## 2022-04-17 ENCOUNTER — Ambulatory Visit (INDEPENDENT_AMBULATORY_CARE_PROVIDER_SITE_OTHER): Payer: Medicare Other | Admitting: Neurology

## 2022-04-17 VITALS — BP 82/50 | HR 100 | Ht 66.0 in | Wt 179.0 lb

## 2022-04-17 DIAGNOSIS — K148 Other diseases of tongue: Secondary | ICD-10-CM

## 2022-04-17 DIAGNOSIS — R404 Transient alteration of awareness: Secondary | ICD-10-CM | POA: Diagnosis not present

## 2022-04-17 DIAGNOSIS — R471 Dysarthria and anarthria: Secondary | ICD-10-CM

## 2022-04-17 DIAGNOSIS — R633 Feeding difficulties, unspecified: Secondary | ICD-10-CM | POA: Diagnosis not present

## 2022-04-17 DIAGNOSIS — G459 Transient cerebral ischemic attack, unspecified: Secondary | ICD-10-CM

## 2022-04-17 DIAGNOSIS — R1310 Dysphagia, unspecified: Secondary | ICD-10-CM

## 2022-04-17 NOTE — Progress Notes (Signed)
NEUROLOGY FOLLOW UP OFFICE NOTE  Hannah Miller [id number] [birth year]  HISTORY OF PRESENT ILLNESS: I had the pleasure of seeing Hannah Miller in follow-up in the neurology clinic on 04/17/2022. She is again accompanied by her daughter who helps supplement the history today. The patient was last seen 8 months ago for transient episode of loss of awareness. She presents today for a different new symptom of inability to move her tongue and swallow. She has not had any further episodes of staring or loss of awareness. These new changes started a month ago. Her daughter lives out of state and came on 4/25 and she was speaking normally at that time. All of a sudden they noticed that her speech was different. Initially it was attributed to her dental plates, then there was note of some tongue swelling and she was prescribed a Medrol dose pack by PCP. It helped "for a second" and speech and swallow were just a little better for a day. She had another Medrol dose pack with no improvement in symptoms. She reports difficulty swallowing, it is hard to get anything down, when she swallows it feels like she is drinking from a straw. She cannot eat solid foods such as meat, she can only eat Cheerios because they dissolve. She drinks a bottle of water a day and family tried to give her a smoothie which she did swallow but she could feel it was hard coming down her throat, having to be cognizant of where it would go. She has been having a hard time swallowing her aspirin, coated medication goes down better. Her voice gets weaker as she talks more. No drooling. There are no other associated symptoms, she denies any significant headaches, diplopia, focal numbness/tingling/weakness. She has baseline bilateral carpal tunnel syndrome that is unchanged. She has been dealing with significant back pain and had a lumbar MRI yesterday, when she stands there is numbness going down her legs. She sees Dr. Annette Miller from Neurosurgery. No  bowel/bladder dysfunction. She feels lightheaded, BP today 82/50.  No family history of similar symptoms. She was evaluated by ENT 2 days ago with concern for neurological cause of symptoms. Neck CT was ordered to assess for underlying structural abnormality. She lives alone. Her family lives in [place] and [place], she will be staying with her other daughter in [place] at the end of next week as she cannot be left alone to care for herself.  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   History on Initial Assessment 08/15/2021: This is a 71 year old right-handed woman with a history of hypertension, hyperlipidemia, diabetes, presenting for evaluation of a transient episode of loss of awareness last 08/11/2021. She was with family and recalls talking to them about their coffeemaker, she sat down, then her next recollection is seeing EMS coming to the door. She recalls feeling fine with no headache, dizziness, focal weakness. She could not recall what he asked but recalls saying she was scared. Hannah Miller reports that while she was sitting, she was about to respond but said "b-b-b" repeatedly. They initially thought she was joking, then she closed her eyes for a second and again said "b-b-b," grabbed hold of the table then her head slowly bent to the right side and she was in a daze, unresponsive to family. They called 911 and family was instructed to ask her questions, she was still glassy but  looking around, able to smile and raise her arms after a little delay. She could not repeat a phrase and kept staring at Hannah Miller, then repeated the first part after the third time. This lasted 2-3 minutes. She was talking when EMS arrived but still with a little delay. No tongue bite or urinary incontinence. They recall BP was a little elevated. In the ER, reportedly she had slight flattening of  the left nasolabial fold and left leg drift. NIHSS was 0. She had a normal wake and drowsy EEG. MRI brain did not show any acute changes. 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 MRA neck normal. Echo showed EF 55-60%, normal left atrium size. LDL 61. HbA1c 10.3. She was discharged home on DAPT with Plavix for 90 days and ASA '325mg'$  daily for life.   They deny any prior similar episodes, none since hospital discharge. Etiology of episode unclear if TIA or seizure. She and her daughter any other staring episodes, gaps in time, olfactory/gustatory hallucinations, deja vu, rising epigastric sensation. She denies any headaches, dizziness, diplopia, dysarthria/dysphagia, neck/back pain. Since July, she has had constant numbness and stiffness in her hands, dropping things. She was putting on makeup one time and realized the applicator was not in her hand. She has throbbing pain in her right forearm and had twitching a minute ago in the right arm. Feet are not affected. She manages her own medications and denies forgetting medications. She works as a Pharmacologist. She had a normal birth and early development.  There is no history of febrile convulsions, CNS infections such as meningitis/encephalitis, significant traumatic brain injury, neurosurgical procedures, or family history of seizures.   PAST MEDICAL HISTORY: Past Medical History:  Diagnosis Date   Diabetes mellitus without complication ([place])     MEDICATIONS: Current Outpatient Medications on File Prior to Visit  Medication Sig Dispense Refill   aspirin EC 325 MG EC tablet Take 1 tablet (325 mg total) by mouth daily. 30 tablet 0   atorvastatin (LIPITOR) 40 MG tablet Take 40 mg by mouth daily.     cholecalciferol (VITAMIN D3) 25 MCG (1000 UNIT) tablet Take 1,000 Units by mouth daily.     glipiZIDE (GLUCOTROL XL)  10 MG 24 hr tablet Take 10 mg by mouth every morning.     insulin lispro protamine-insulin lispro (HUMALOG 75/25) (75-25) 100 UNIT/ML SUSP Inject 50 Units into the skin in the morning, at noon, and at bedtime.     lisinopril (PRINIVIL,ZESTRIL) 5 MG tablet Take 5 mg by mouth daily.     ondansetron (ZOFRAN-ODT) 4 MG disintegrating tablet Take 1 tablet (4 mg total) by mouth every 8 (eight) hours as needed for nausea or vomiting. 20 tablet 0   vitamin B-12 (CYANOCOBALAMIN) 500 MCG tablet Take 500 mcg by mouth daily.     clopidogrel (PLAVIX) 75 MG tablet Take 1 tablet (75 mg total) by mouth daily. (Patient not taking: Reported on 04/17/2022) 30 tablet 11   [DISCONTINUED] metFORMIN (GLUCOPHAGE) 1000 MG tablet Take 1,000 mg by mouth 2 (two) times daily with a meal.     No current facility-administered medications on file prior to visit.    ALLERGIES: No Known Allergies  FAMILY HISTORY: Family History  Problem Relation Age of Onset   Healthy Mother    Healthy Father     SOCIAL HISTORY: Social History   Socioeconomic History   Marital  status: Divorced    Spouse name: Not on file   Number of children: Not on file   Years of education: Not on file   Highest education level: Not on file  Occupational History   Not on file  Tobacco Use   Smoking status: Former    Types: Cigarettes   Smokeless tobacco: Never  Vaping Use   Vaping Use: Never used  Substance and Sexual Activity   Alcohol use: Not Currently   Drug use: Never   Sexual activity: Not Currently    Birth control/protection: None  Other Topics Concern   Not on file  Social History Narrative   Right handed    One story 6 stair to get into home   Caffeine 1 daily   Lives alone   Social Determinants of Health   Financial Resource Strain: Not on file  Food Insecurity: Not on file  Transportation Needs: Not on file  Physical Activity: Not on file  Stress: Not on file  Social Connections: Not on file  Intimate Partner  Violence: Not on file     PHYSICAL EXAM: Vitals:   04/17/22 0957 04/17/22 1018  BP: (!) 82/50   Pulse: (!) 107 100  SpO2: 98%    General: No acute distress Head:  Normocephalic/atraumatic Skin/Extremities: No rash, no edema Neurological Exam: alert and awake. She has mild dysarthria, no aphasia. Fund of knowledge is appropriate.  Attention and concentration are normal.   Cranial nerves: Pupils equal, round. Extraocular movements intact with no nystagmus. No ptosis or fatigable ptosis on prolonged upgaze. Visual fields full.  No facial asymmetry. She has difficulty protruding her tongue or pressing the tongue against each cheek (worse on left). No atrophy, fasciculations, or deviation seen.  Motor: Bulk and tone normal, muscle strength 5/5 throughout with no pronator drift. Sensation intact to pin, cold, vibration sense. Reflexes +2 on both UE, unable to elicit on both LE. Gait slow and cautious, needing assistance to stand due to severe back pain, no ataxia.    IMPRESSION: This is a 71 yo RH woman with a history of hypertension, hyperlipidemia, diabetes, who initially presented after an episode of transient loss of awareness in 07/2021. She presents today for new symptoms of sudden onset tongue weakness leading to speech and swallowing changes over the past month. Etiology is unclear. MRI brain with and without contrast will be ordered to assess for underlying structural abnormality such as a brainstem stroke. She has a history of intracranial stenosis. She has no other focal deficits on exam. Check myasthenia panel. She will be scheduled for a swallow evaluation with speech therapy. Continue aspirin and control of vascular risk factors. She is hypotensive and tachycardic, likely dehydrated due to poor PO intake. She will be in [place] to have help at home and will be needing continued care there. They know to call for any changes.    Thank you for allowing me to participate in her care.  Please  do not hesitate to call for any questions or concerns.    Ellouise Newer, M.D.   CC: Dr. Redmond Baseman, Arthur Holms, NP

## 2022-04-17 NOTE — Patient Instructions (Addendum)
Schedule open MRI brain with and without contrast  2. Bloodwork for myasthenia panel  3. Schedule swallow evaluation with speech therapist  4. Continue trying to push fluids as best you can. Proceed with taking the coated aspirin

## 2022-04-21 DIAGNOSIS — K148 Other diseases of tongue: Secondary | ICD-10-CM | POA: Diagnosis not present

## 2022-04-21 DIAGNOSIS — R471 Dysarthria and anarthria: Secondary | ICD-10-CM | POA: Diagnosis not present

## 2022-04-21 DIAGNOSIS — R404 Transient alteration of awareness: Secondary | ICD-10-CM | POA: Diagnosis not present

## 2022-04-21 DIAGNOSIS — R9082 White matter disease, unspecified: Secondary | ICD-10-CM | POA: Diagnosis not present

## 2022-04-22 ENCOUNTER — Ambulatory Visit (HOSPITAL_COMMUNITY)
Admission: RE | Admit: 2022-04-22 | Discharge: 2022-04-22 | Disposition: A | Discharge status: Home / Self Care | Payer: Medicare Other | Source: Ambulatory Visit | Attending: Neurology | Admitting: Neurology

## 2022-04-22 DIAGNOSIS — R633 Feeding difficulties, unspecified: Secondary | ICD-10-CM

## 2022-04-22 DIAGNOSIS — R1310 Dysphagia, unspecified: Secondary | ICD-10-CM | POA: Diagnosis not present

## 2022-04-22 DIAGNOSIS — K148 Other diseases of tongue: Secondary | ICD-10-CM | POA: Insufficient documentation

## 2022-04-22 DIAGNOSIS — G459 Transient cerebral ischemic attack, unspecified: Secondary | ICD-10-CM | POA: Diagnosis not present

## 2022-04-22 DIAGNOSIS — E119 Type 2 diabetes mellitus without complications: Secondary | ICD-10-CM | POA: Insufficient documentation

## 2022-04-22 DIAGNOSIS — R471 Dysarthria and anarthria: Secondary | ICD-10-CM | POA: Insufficient documentation

## 2022-04-22 DIAGNOSIS — R1312 Dysphagia, oropharyngeal phase: Secondary | ICD-10-CM | POA: Diagnosis not present

## 2022-04-22 DIAGNOSIS — K224 Dyskinesia of esophagus: Secondary | ICD-10-CM | POA: Diagnosis not present

## 2022-04-22 NOTE — Progress Notes (Signed)
Modified Barium Swallow Progress Note  Patient Details  Name: Hannah Miller MRN: [id number] Date of Birth: Jul 16, 1951  Today's Date: 04/22/2022  Modified Barium Swallow completed.  Full report located under Chart Review in the Imaging Section.  Brief recommendations include the following:  Clinical Impression  Pt exhibits minimal oropharyngeal dysphagia without aspiration and with suspected significant esophageal involvement. Presents with limited lingual lateral, protrusion and elevation on command with mildly dysarthric speech (more ROM with lateralization). By the end of study she was able to protrude tongue slightly beyond lower dentition. Orally pt was slower with mildy decreased manipulation and propulsion but able to cup boluses and elevate tongue to hard palate for transit with consistent min-mild lingual residue post swallow. She was able to masticate and propel solid texture without significant delays. Pt did gag x 2 during oral propulsion of puree. Timing of swallow was overall adequate for 90% of swallows with one instance of flash laryngeal penetration which exited vestibule spontaneously during the swallow. There was trace-min residue in valleculae and pyriform sinuses and clearance with second swallow. MBS does not diagnose below the level of the UES however esophagus was scanned revealing significant residue throughout that did not clear with additional time or liquid bolus. This may account for her gagging during the study, report of food "not going down or coming back up sometimes and not wanting more than 3-4 bites before stating she is done with the meal. Recommend she continue thin liquid, add back in soft solid textures (soft pasta, mashed potatoes, aoft fruit etc), crush pills, esophageal precautions. Recommend GI consult and daughter reports she has GI appointment set up in June in [place].   Swallow Evaluation Recommendations   Recommended Consults: Consider GI  evaluation;Consider esophageal assessment   SLP Diet Recommendations: Thin liquid;Other (Comment) (soft solids discussed with pt and dtr)   Liquid Administration via: Cup;Straw   Medication Administration: Crushed with puree   Supervision: Patient able to self feed   Compensations: Slow rate;Small sips/bites   Postural Changes: Seated upright at 90 degrees;Remain semi-upright after after feeds/meals (Comment)   Oral Care Recommendations: Oral care BID        Hannah Miller 04/22/2022,1:11 PM

## 2022-04-23 ENCOUNTER — Telehealth: Payer: Self-pay | Admitting: Neurology

## 2022-04-23 ENCOUNTER — Other Ambulatory Visit: Payer: Self-pay | Admitting: Neurosurgery

## 2022-04-23 DIAGNOSIS — M48062 Spinal stenosis, lumbar region with neurogenic claudication: Secondary | ICD-10-CM

## 2022-04-23 NOTE — Telephone Encounter (Signed)
Pls let daughter know that thankfully the brain MRI did not show any stroke, tumor, or bleed. I reviewed her swallow study and it was noted that the problem is in her esophagus, so GI specialist needs to be involved. Continue with recommendations given by speech therapist for swallowing until she sees GI. Thanks

## 2022-04-23 NOTE — Telephone Encounter (Signed)
Patient had her swallow test and MRI. She is leaving for [facility] Sunday the 4th. Does aquino need to see her before she leaves? Dont want her to go out of town if something is wrong. The only time she can come is 6/1 or 6/2.

## 2022-04-24 ENCOUNTER — Ambulatory Visit
Admission: RE | Admit: 2022-04-24 | Discharge: 2022-04-24 | Disposition: A | Discharge status: Home / Self Care | Payer: Medicare Other | Source: Ambulatory Visit | Attending: Neurosurgery | Admitting: Neurosurgery

## 2022-04-24 DIAGNOSIS — M48062 Spinal stenosis, lumbar region with neurogenic claudication: Secondary | ICD-10-CM

## 2022-04-24 MED ORDER — IOPAMIDOL (ISOVUE-M 200) INJECTION 41%
1.0000 mL | Freq: Once | INTRAMUSCULAR | Status: AC
Start: 2022-04-24 — End: 2022-04-24
  Administered 2022-04-24: 1 mL via EPIDURAL

## 2022-04-24 MED ORDER — METHYLPREDNISOLONE ACETATE 40 MG/ML INJ SUSP (RADIOLOG
80.0000 mg | Freq: Once | INTRAMUSCULAR | Status: AC
Start: 2022-04-24 — End: 2022-04-24
  Administered 2022-04-24: 80 mg via EPIDURAL

## 2022-04-24 NOTE — Telephone Encounter (Signed)
Called Pt and gave results of MRI per Dr. Delice Lesch, Told her we could referral a GI but she is going out of state for 1 1/2 months so her daughter said they would take care of that .

## 2022-04-24 NOTE — Discharge Instructions (Signed)

## 2022-04-24 NOTE — Progress Notes (Signed)
Pt brought into nursing recovery area post Lumbar epidural. Pts daughter with pt. Pt reports she was feeling "light headed" VS obtained. See flowsheets. Pt was placed in trendelenburg position and pt stated light headedness improved. Pt never lost consciousness and remained alert. Pt was eventually sat back up in the recovery chair to an up right position. The patients BP did trend down but per the patient and daughter the patients BP has been around 90-100/40-50 the last few days. The patient denied any dizziness or lightheadedness after being sat back up. The patient reported "I am ready to get out of here". Explained to the patient and daughter the importance of checking her BP at home and calling her PCP if it remains low. The daughter verbalized understanding and stated they recently stopped her BP medications due to this. Pt was ultimately discharged from our facility, reporting "I feel fine" and the daughter reported she would check the patients BP at home.

## 2022-04-27 LAB — MYASTHENIA GRAVIS PANEL 1
A CHR BINDING ABS: <0.3 nmol/L
STRIATED MUSCLE AB SCREEN: NEGATIVE

## 2022-05-01 DIAGNOSIS — D72820 Lymphocytosis (symptomatic): Secondary | ICD-10-CM | POA: Diagnosis not present

## 2022-05-01 DIAGNOSIS — J690 Pneumonitis due to inhalation of food and vomit: Secondary | ICD-10-CM | POA: Diagnosis not present

## 2022-05-01 DIAGNOSIS — R1310 Dysphagia, unspecified: Secondary | ICD-10-CM | POA: Diagnosis not present

## 2022-05-01 DIAGNOSIS — E1165 Type 2 diabetes mellitus with hyperglycemia: Secondary | ICD-10-CM | POA: Diagnosis not present

## 2022-05-01 DIAGNOSIS — D469 Myelodysplastic syndrome, unspecified: Secondary | ICD-10-CM | POA: Diagnosis not present

## 2022-05-01 DIAGNOSIS — K2960 Other gastritis without bleeding: Secondary | ICD-10-CM | POA: Diagnosis not present

## 2022-05-01 DIAGNOSIS — K209 Esophagitis, unspecified without bleeding: Secondary | ICD-10-CM | POA: Diagnosis not present

## 2022-05-01 DIAGNOSIS — D619 Aplastic anemia, unspecified: Secondary | ICD-10-CM | POA: Diagnosis not present

## 2022-05-01 DIAGNOSIS — E1169 Type 2 diabetes mellitus with other specified complication: Secondary | ICD-10-CM | POA: Diagnosis not present

## 2022-05-01 DIAGNOSIS — K5900 Constipation, unspecified: Secondary | ICD-10-CM | POA: Diagnosis not present

## 2022-05-01 DIAGNOSIS — K8020 Calculus of gallbladder without cholecystitis without obstruction: Secondary | ICD-10-CM | POA: Diagnosis not present

## 2022-05-01 DIAGNOSIS — R1314 Dysphagia, pharyngoesophageal phase: Secondary | ICD-10-CM | POA: Diagnosis not present

## 2022-05-01 DIAGNOSIS — E8770 Fluid overload, unspecified: Secondary | ICD-10-CM | POA: Diagnosis not present

## 2022-05-01 DIAGNOSIS — Z736 Limitation of activities due to disability: Secondary | ICD-10-CM | POA: Diagnosis not present

## 2022-05-01 DIAGNOSIS — D519 Vitamin B12 deficiency anemia, unspecified: Secondary | ICD-10-CM | POA: Diagnosis not present

## 2022-05-01 DIAGNOSIS — E1142 Type 2 diabetes mellitus with diabetic polyneuropathy: Secondary | ICD-10-CM | POA: Diagnosis present

## 2022-05-01 DIAGNOSIS — R918 Other nonspecific abnormal finding of lung field: Secondary | ICD-10-CM | POA: Diagnosis not present

## 2022-05-01 DIAGNOSIS — R338 Other retention of urine: Secondary | ICD-10-CM | POA: Diagnosis not present

## 2022-05-01 DIAGNOSIS — K3189 Other diseases of stomach and duodenum: Secondary | ICD-10-CM | POA: Diagnosis not present

## 2022-05-01 DIAGNOSIS — E785 Hyperlipidemia, unspecified: Secondary | ICD-10-CM | POA: Diagnosis present

## 2022-05-01 DIAGNOSIS — E860 Dehydration: Secondary | ICD-10-CM | POA: Diagnosis not present

## 2022-05-01 DIAGNOSIS — R1312 Dysphagia, oropharyngeal phase: Secondary | ICD-10-CM | POA: Diagnosis not present

## 2022-05-01 DIAGNOSIS — D696 Thrombocytopenia, unspecified: Secondary | ICD-10-CM | POA: Diagnosis not present

## 2022-05-01 DIAGNOSIS — R339 Retention of urine, unspecified: Secondary | ICD-10-CM | POA: Diagnosis not present

## 2022-05-01 DIAGNOSIS — F419 Anxiety disorder, unspecified: Secondary | ICD-10-CM | POA: Diagnosis not present

## 2022-05-01 DIAGNOSIS — M6281 Muscle weakness (generalized): Secondary | ICD-10-CM | POA: Diagnosis not present

## 2022-05-01 DIAGNOSIS — D84821 Immunodeficiency due to drugs: Secondary | ICD-10-CM | POA: Diagnosis not present

## 2022-05-01 DIAGNOSIS — R0602 Shortness of breath: Secondary | ICD-10-CM | POA: Diagnosis not present

## 2022-05-01 DIAGNOSIS — E43 Unspecified severe protein-calorie malnutrition: Secondary | ICD-10-CM | POA: Diagnosis not present

## 2022-05-01 DIAGNOSIS — R262 Difficulty in walking, not elsewhere classified: Secondary | ICD-10-CM | POA: Diagnosis not present

## 2022-05-01 DIAGNOSIS — J984 Other disorders of lung: Secondary | ICD-10-CM | POA: Diagnosis not present

## 2022-05-01 DIAGNOSIS — Z452 Encounter for adjustment and management of vascular access device: Secondary | ICD-10-CM | POA: Diagnosis not present

## 2022-05-01 DIAGNOSIS — E8809 Other disorders of plasma-protein metabolism, not elsewhere classified: Secondary | ICD-10-CM | POA: Diagnosis not present

## 2022-05-01 DIAGNOSIS — E538 Deficiency of other specified B group vitamins: Secondary | ICD-10-CM | POA: Diagnosis present

## 2022-05-01 DIAGNOSIS — E119 Type 2 diabetes mellitus without complications: Secondary | ICD-10-CM | POA: Diagnosis not present

## 2022-05-01 DIAGNOSIS — K2289 Other specified disease of esophagus: Secondary | ICD-10-CM | POA: Diagnosis not present

## 2022-05-01 DIAGNOSIS — D649 Anemia, unspecified: Secondary | ICD-10-CM | POA: Diagnosis not present

## 2022-05-01 DIAGNOSIS — D472 Monoclonal gammopathy: Secondary | ICD-10-CM | POA: Diagnosis not present

## 2022-05-01 DIAGNOSIS — R102 Pelvic and perineal pain: Secondary | ICD-10-CM | POA: Diagnosis not present

## 2022-05-01 DIAGNOSIS — K2950 Unspecified chronic gastritis without bleeding: Secondary | ICD-10-CM | POA: Diagnosis not present

## 2022-05-01 DIAGNOSIS — M48062 Spinal stenosis, lumbar region with neurogenic claudication: Secondary | ICD-10-CM | POA: Diagnosis not present

## 2022-05-01 DIAGNOSIS — R0902 Hypoxemia: Secondary | ICD-10-CM | POA: Diagnosis not present

## 2022-05-01 DIAGNOSIS — K149 Disease of tongue, unspecified: Secondary | ICD-10-CM | POA: Diagnosis not present

## 2022-05-01 DIAGNOSIS — N179 Acute kidney failure, unspecified: Secondary | ICD-10-CM | POA: Diagnosis not present

## 2022-05-01 DIAGNOSIS — R634 Abnormal weight loss: Secondary | ICD-10-CM | POA: Diagnosis not present

## 2022-05-01 DIAGNOSIS — I10 Essential (primary) hypertension: Secondary | ICD-10-CM | POA: Diagnosis not present

## 2022-05-01 DIAGNOSIS — D6489 Other specified anemias: Secondary | ICD-10-CM | POA: Diagnosis not present

## 2022-05-01 DIAGNOSIS — K2970 Gastritis, unspecified, without bleeding: Secondary | ICD-10-CM | POA: Diagnosis not present

## 2022-05-01 DIAGNOSIS — K148 Other diseases of tongue: Secondary | ICD-10-CM | POA: Diagnosis not present

## 2022-05-01 DIAGNOSIS — D638 Anemia in other chronic diseases classified elsewhere: Secondary | ICD-10-CM | POA: Diagnosis not present

## 2022-05-01 DIAGNOSIS — Z20822 Contact with and (suspected) exposure to covid-19: Secondary | ICD-10-CM | POA: Diagnosis not present

## 2022-05-01 DIAGNOSIS — K3184 Gastroparesis: Secondary | ICD-10-CM | POA: Diagnosis not present

## 2022-05-01 DIAGNOSIS — Z794 Long term (current) use of insulin: Secondary | ICD-10-CM | POA: Diagnosis not present

## 2022-05-01 DIAGNOSIS — Z4659 Encounter for fitting and adjustment of other gastrointestinal appliance and device: Secondary | ICD-10-CM | POA: Diagnosis not present

## 2022-05-01 DIAGNOSIS — K5904 Chronic idiopathic constipation: Secondary | ICD-10-CM | POA: Diagnosis not present

## 2022-05-01 DIAGNOSIS — I2510 Atherosclerotic heart disease of native coronary artery without angina pectoris: Secondary | ICD-10-CM | POA: Diagnosis not present

## 2022-05-01 DIAGNOSIS — J810 Acute pulmonary edema: Secondary | ICD-10-CM | POA: Diagnosis not present

## 2022-05-01 DIAGNOSIS — K5909 Other constipation: Secondary | ICD-10-CM | POA: Diagnosis not present

## 2022-05-01 DIAGNOSIS — R9431 Abnormal electrocardiogram [ECG] [EKG]: Secondary | ICD-10-CM | POA: Diagnosis not present

## 2022-05-01 DIAGNOSIS — R1319 Other dysphagia: Secondary | ICD-10-CM | POA: Diagnosis not present

## 2022-05-01 DIAGNOSIS — C9000 Multiple myeloma not having achieved remission: Secondary | ICD-10-CM | POA: Diagnosis not present

## 2022-05-01 DIAGNOSIS — F17210 Nicotine dependence, cigarettes, uncomplicated: Secondary | ICD-10-CM | POA: Diagnosis not present

## 2022-05-01 DIAGNOSIS — M47816 Spondylosis without myelopathy or radiculopathy, lumbar region: Secondary | ICD-10-CM | POA: Diagnosis not present

## 2022-05-01 DIAGNOSIS — F5104 Psychophysiologic insomnia: Secondary | ICD-10-CM | POA: Diagnosis not present

## 2022-05-01 DIAGNOSIS — J90 Pleural effusion, not elsewhere classified: Secondary | ICD-10-CM | POA: Diagnosis not present

## 2022-05-01 DIAGNOSIS — D630 Anemia in neoplastic disease: Secondary | ICD-10-CM | POA: Diagnosis present

## 2022-05-01 DIAGNOSIS — G5603 Carpal tunnel syndrome, bilateral upper limbs: Secondary | ICD-10-CM | POA: Diagnosis not present

## 2022-05-01 DIAGNOSIS — J9811 Atelectasis: Secondary | ICD-10-CM | POA: Diagnosis not present

## 2022-05-01 DIAGNOSIS — R6339 Other feeding difficulties: Secondary | ICD-10-CM | POA: Diagnosis not present

## 2022-05-01 DIAGNOSIS — R630 Anorexia: Secondary | ICD-10-CM | POA: Diagnosis not present

## 2022-05-02 ENCOUNTER — Telehealth: Payer: Self-pay | Admitting: Neurology

## 2022-05-02 DIAGNOSIS — K2289 Other specified disease of esophagus: Secondary | ICD-10-CM | POA: Diagnosis not present

## 2022-05-02 DIAGNOSIS — R1314 Dysphagia, pharyngoesophageal phase: Secondary | ICD-10-CM | POA: Diagnosis not present

## 2022-05-02 DIAGNOSIS — D519 Vitamin B12 deficiency anemia, unspecified: Secondary | ICD-10-CM | POA: Diagnosis not present

## 2022-05-02 DIAGNOSIS — M48062 Spinal stenosis, lumbar region with neurogenic claudication: Secondary | ICD-10-CM | POA: Diagnosis not present

## 2022-05-02 DIAGNOSIS — R1312 Dysphagia, oropharyngeal phase: Secondary | ICD-10-CM | POA: Diagnosis not present

## 2022-05-02 NOTE — Telephone Encounter (Signed)
I think she will have to personally request from [facility] because we don't have any permissions from either place [place] or [place]) to do it, thanks

## 2022-05-02 NOTE — Telephone Encounter (Signed)
Pt Daughter called an informed she needs to call [facility] to do a release so they could send the CD to the hospital in [place]

## 2022-05-02 NOTE — Telephone Encounter (Signed)
Patients daughter called and stated Hannah Miller had an MRI done.  She states that [facility] in [place] needs a copy of the scans.  Can these be sent or uploaded to mychart?

## 2022-05-03 DIAGNOSIS — R918 Other nonspecific abnormal finding of lung field: Secondary | ICD-10-CM | POA: Diagnosis not present

## 2022-05-03 DIAGNOSIS — J690 Pneumonitis due to inhalation of food and vomit: Secondary | ICD-10-CM | POA: Diagnosis not present

## 2022-05-03 DIAGNOSIS — R1314 Dysphagia, pharyngoesophageal phase: Secondary | ICD-10-CM | POA: Diagnosis not present

## 2022-05-03 DIAGNOSIS — K5904 Chronic idiopathic constipation: Secondary | ICD-10-CM | POA: Diagnosis not present

## 2022-05-06 DIAGNOSIS — R0602 Shortness of breath: Secondary | ICD-10-CM | POA: Diagnosis not present

## 2022-05-06 DIAGNOSIS — J984 Other disorders of lung: Secondary | ICD-10-CM | POA: Diagnosis not present

## 2022-05-07 DIAGNOSIS — Z452 Encounter for adjustment and management of vascular access device: Secondary | ICD-10-CM | POA: Diagnosis not present

## 2022-05-07 DIAGNOSIS — R1310 Dysphagia, unspecified: Secondary | ICD-10-CM | POA: Diagnosis not present

## 2022-05-08 DIAGNOSIS — Z4659 Encounter for fitting and adjustment of other gastrointestinal appliance and device: Secondary | ICD-10-CM | POA: Diagnosis not present

## 2022-05-09 DIAGNOSIS — R1314 Dysphagia, pharyngoesophageal phase: Secondary | ICD-10-CM | POA: Diagnosis not present

## 2022-05-09 DIAGNOSIS — D649 Anemia, unspecified: Secondary | ICD-10-CM | POA: Diagnosis not present

## 2022-05-09 DIAGNOSIS — E43 Unspecified severe protein-calorie malnutrition: Secondary | ICD-10-CM | POA: Diagnosis not present

## 2022-05-09 DIAGNOSIS — D472 Monoclonal gammopathy: Secondary | ICD-10-CM | POA: Diagnosis not present

## 2022-05-13 DIAGNOSIS — J90 Pleural effusion, not elsewhere classified: Secondary | ICD-10-CM | POA: Diagnosis not present

## 2022-05-19 DIAGNOSIS — D630 Anemia in neoplastic disease: Secondary | ICD-10-CM | POA: Diagnosis not present

## 2022-05-19 DIAGNOSIS — R1314 Dysphagia, pharyngoesophageal phase: Secondary | ICD-10-CM | POA: Diagnosis not present

## 2022-05-19 DIAGNOSIS — R140 Abdominal distension (gaseous): Secondary | ICD-10-CM | POA: Diagnosis not present

## 2022-05-19 DIAGNOSIS — E875 Hyperkalemia: Secondary | ICD-10-CM | POA: Diagnosis not present

## 2022-05-19 DIAGNOSIS — I462 Cardiac arrest due to underlying cardiac condition: Secondary | ICD-10-CM | POA: Diagnosis not present

## 2022-05-19 DIAGNOSIS — R5381 Other malaise: Secondary | ICD-10-CM | POA: Diagnosis not present

## 2022-05-19 DIAGNOSIS — E1142 Type 2 diabetes mellitus with diabetic polyneuropathy: Secondary | ICD-10-CM | POA: Diagnosis not present

## 2022-05-19 DIAGNOSIS — J9621 Acute and chronic respiratory failure with hypoxia: Secondary | ICD-10-CM | POA: Diagnosis not present

## 2022-05-19 DIAGNOSIS — R9431 Abnormal electrocardiogram [ECG] [EKG]: Secondary | ICD-10-CM | POA: Diagnosis not present

## 2022-05-19 DIAGNOSIS — Z7982 Long term (current) use of aspirin: Secondary | ICD-10-CM | POA: Diagnosis not present

## 2022-05-19 DIAGNOSIS — E162 Hypoglycemia, unspecified: Secondary | ICD-10-CM | POA: Diagnosis not present

## 2022-05-19 DIAGNOSIS — E1165 Type 2 diabetes mellitus with hyperglycemia: Secondary | ICD-10-CM | POA: Diagnosis not present

## 2022-05-19 DIAGNOSIS — I272 Pulmonary hypertension, unspecified: Secondary | ICD-10-CM | POA: Diagnosis not present

## 2022-05-19 DIAGNOSIS — E538 Deficiency of other specified B group vitamins: Secondary | ICD-10-CM | POA: Diagnosis not present

## 2022-05-19 DIAGNOSIS — F17210 Nicotine dependence, cigarettes, uncomplicated: Secondary | ICD-10-CM | POA: Diagnosis not present

## 2022-05-19 DIAGNOSIS — F419 Anxiety disorder, unspecified: Secondary | ICD-10-CM | POA: Diagnosis not present

## 2022-05-19 DIAGNOSIS — Z87891 Personal history of nicotine dependence: Secondary | ICD-10-CM | POA: Diagnosis not present

## 2022-05-19 DIAGNOSIS — R1310 Dysphagia, unspecified: Secondary | ICD-10-CM | POA: Diagnosis present

## 2022-05-19 DIAGNOSIS — G9340 Encephalopathy, unspecified: Secondary | ICD-10-CM | POA: Diagnosis not present

## 2022-05-19 DIAGNOSIS — R262 Difficulty in walking, not elsewhere classified: Secondary | ICD-10-CM | POA: Diagnosis not present

## 2022-05-19 DIAGNOSIS — J9622 Acute and chronic respiratory failure with hypercapnia: Secondary | ICD-10-CM | POA: Diagnosis not present

## 2022-05-19 DIAGNOSIS — I5022 Chronic systolic (congestive) heart failure: Secondary | ICD-10-CM | POA: Diagnosis not present

## 2022-05-19 DIAGNOSIS — K7200 Acute and subacute hepatic failure without coma: Secondary | ICD-10-CM | POA: Diagnosis not present

## 2022-05-19 DIAGNOSIS — E119 Type 2 diabetes mellitus without complications: Secondary | ICD-10-CM | POA: Diagnosis not present

## 2022-05-19 DIAGNOSIS — R7989 Other specified abnormal findings of blood chemistry: Secondary | ICD-10-CM | POA: Diagnosis not present

## 2022-05-19 DIAGNOSIS — R579 Shock, unspecified: Secondary | ICD-10-CM | POA: Diagnosis not present

## 2022-05-19 DIAGNOSIS — M6281 Muscle weakness (generalized): Secondary | ICD-10-CM | POA: Diagnosis not present

## 2022-05-19 DIAGNOSIS — I110 Hypertensive heart disease with heart failure: Secondary | ICD-10-CM | POA: Diagnosis not present

## 2022-05-19 DIAGNOSIS — J9601 Acute respiratory failure with hypoxia: Secondary | ICD-10-CM | POA: Diagnosis not present

## 2022-05-19 DIAGNOSIS — E1169 Type 2 diabetes mellitus with other specified complication: Secondary | ICD-10-CM | POA: Diagnosis not present

## 2022-05-19 DIAGNOSIS — D619 Aplastic anemia, unspecified: Secondary | ICD-10-CM | POA: Diagnosis not present

## 2022-05-19 DIAGNOSIS — G931 Anoxic brain damage, not elsewhere classified: Secondary | ICD-10-CM | POA: Diagnosis not present

## 2022-05-19 DIAGNOSIS — Z4682 Encounter for fitting and adjustment of non-vascular catheter: Secondary | ICD-10-CM | POA: Diagnosis not present

## 2022-05-19 DIAGNOSIS — J9602 Acute respiratory failure with hypercapnia: Secondary | ICD-10-CM | POA: Diagnosis not present

## 2022-05-19 DIAGNOSIS — N179 Acute kidney failure, unspecified: Secondary | ICD-10-CM | POA: Diagnosis not present

## 2022-05-19 DIAGNOSIS — R799 Abnormal finding of blood chemistry, unspecified: Secondary | ICD-10-CM | POA: Diagnosis not present

## 2022-05-19 DIAGNOSIS — Z79899 Other long term (current) drug therapy: Secondary | ICD-10-CM | POA: Diagnosis not present

## 2022-05-19 DIAGNOSIS — Z794 Long term (current) use of insulin: Secondary | ICD-10-CM | POA: Diagnosis not present

## 2022-05-19 DIAGNOSIS — C9000 Multiple myeloma not having achieved remission: Secondary | ICD-10-CM | POA: Diagnosis not present

## 2022-05-19 DIAGNOSIS — J189 Pneumonia, unspecified organism: Secondary | ICD-10-CM | POA: Diagnosis not present

## 2022-05-19 DIAGNOSIS — E785 Hyperlipidemia, unspecified: Secondary | ICD-10-CM | POA: Diagnosis not present

## 2022-05-19 DIAGNOSIS — R000 Tachycardia, unspecified: Secondary | ICD-10-CM | POA: Diagnosis not present

## 2022-05-19 DIAGNOSIS — Z931 Gastrostomy status: Secondary | ICD-10-CM | POA: Diagnosis not present

## 2022-05-19 DIAGNOSIS — I10 Essential (primary) hypertension: Secondary | ICD-10-CM | POA: Diagnosis not present

## 2022-05-19 DIAGNOSIS — I469 Cardiac arrest, cause unspecified: Secondary | ICD-10-CM | POA: Diagnosis not present

## 2022-05-19 DIAGNOSIS — Z736 Limitation of activities due to disability: Secondary | ICD-10-CM | POA: Diagnosis not present

## 2022-05-19 DIAGNOSIS — R4182 Altered mental status, unspecified: Secondary | ICD-10-CM | POA: Diagnosis not present

## 2022-05-19 DIAGNOSIS — R778 Other specified abnormalities of plasma proteins: Secondary | ICD-10-CM | POA: Diagnosis not present

## 2022-05-19 DIAGNOSIS — R300 Dysuria: Secondary | ICD-10-CM | POA: Diagnosis not present

## 2022-05-19 DIAGNOSIS — I071 Rheumatic tricuspid insufficiency: Secondary | ICD-10-CM | POA: Diagnosis not present

## 2022-05-19 DIAGNOSIS — Z66 Do not resuscitate: Secondary | ICD-10-CM | POA: Diagnosis not present

## 2022-05-19 DIAGNOSIS — E43 Unspecified severe protein-calorie malnutrition: Secondary | ICD-10-CM | POA: Diagnosis not present

## 2022-05-19 DIAGNOSIS — Z6829 Body mass index (BMI) 29.0-29.9, adult: Secondary | ICD-10-CM | POA: Diagnosis not present

## 2022-05-19 DIAGNOSIS — R739 Hyperglycemia, unspecified: Secondary | ICD-10-CM | POA: Diagnosis not present

## 2022-05-19 DIAGNOSIS — E559 Vitamin D deficiency, unspecified: Secondary | ICD-10-CM | POA: Diagnosis present

## 2022-05-19 DIAGNOSIS — D649 Anemia, unspecified: Secondary | ICD-10-CM | POA: Diagnosis not present

## 2022-05-19 DIAGNOSIS — M48062 Spinal stenosis, lumbar region with neurogenic claudication: Secondary | ICD-10-CM | POA: Diagnosis not present

## 2022-05-20 DIAGNOSIS — I10 Essential (primary) hypertension: Secondary | ICD-10-CM | POA: Diagnosis not present

## 2022-05-20 DIAGNOSIS — E119 Type 2 diabetes mellitus without complications: Secondary | ICD-10-CM | POA: Diagnosis not present

## 2022-05-20 DIAGNOSIS — R262 Difficulty in walking, not elsewhere classified: Secondary | ICD-10-CM | POA: Diagnosis not present

## 2022-05-20 DIAGNOSIS — R9431 Abnormal electrocardiogram [ECG] [EKG]: Secondary | ICD-10-CM | POA: Diagnosis not present

## 2022-05-20 DIAGNOSIS — M48062 Spinal stenosis, lumbar region with neurogenic claudication: Secondary | ICD-10-CM | POA: Diagnosis not present

## 2022-05-20 DIAGNOSIS — E785 Hyperlipidemia, unspecified: Secondary | ICD-10-CM | POA: Diagnosis not present

## 2022-05-20 DIAGNOSIS — Z736 Limitation of activities due to disability: Secondary | ICD-10-CM | POA: Diagnosis not present

## 2022-05-20 DIAGNOSIS — D649 Anemia, unspecified: Secondary | ICD-10-CM | POA: Diagnosis not present

## 2022-05-20 DIAGNOSIS — C9000 Multiple myeloma not having achieved remission: Secondary | ICD-10-CM | POA: Diagnosis not present

## 2022-05-20 DIAGNOSIS — M6281 Muscle weakness (generalized): Secondary | ICD-10-CM | POA: Diagnosis not present

## 2022-05-21 DIAGNOSIS — I10 Essential (primary) hypertension: Secondary | ICD-10-CM | POA: Diagnosis not present

## 2022-05-21 DIAGNOSIS — C9000 Multiple myeloma not having achieved remission: Secondary | ICD-10-CM | POA: Diagnosis not present

## 2022-05-21 DIAGNOSIS — R262 Difficulty in walking, not elsewhere classified: Secondary | ICD-10-CM | POA: Diagnosis not present

## 2022-05-21 DIAGNOSIS — E785 Hyperlipidemia, unspecified: Secondary | ICD-10-CM | POA: Diagnosis not present

## 2022-05-21 DIAGNOSIS — Z736 Limitation of activities due to disability: Secondary | ICD-10-CM | POA: Diagnosis not present

## 2022-05-21 DIAGNOSIS — R739 Hyperglycemia, unspecified: Secondary | ICD-10-CM | POA: Diagnosis not present

## 2022-05-21 DIAGNOSIS — D649 Anemia, unspecified: Secondary | ICD-10-CM | POA: Diagnosis not present

## 2022-05-21 DIAGNOSIS — M48062 Spinal stenosis, lumbar region with neurogenic claudication: Secondary | ICD-10-CM | POA: Diagnosis not present

## 2022-05-21 DIAGNOSIS — M6281 Muscle weakness (generalized): Secondary | ICD-10-CM | POA: Diagnosis not present

## 2022-05-22 DIAGNOSIS — M6281 Muscle weakness (generalized): Secondary | ICD-10-CM | POA: Diagnosis not present

## 2022-05-22 DIAGNOSIS — M48062 Spinal stenosis, lumbar region with neurogenic claudication: Secondary | ICD-10-CM | POA: Diagnosis not present

## 2022-05-22 DIAGNOSIS — R262 Difficulty in walking, not elsewhere classified: Secondary | ICD-10-CM | POA: Diagnosis not present

## 2022-05-22 DIAGNOSIS — R739 Hyperglycemia, unspecified: Secondary | ICD-10-CM | POA: Diagnosis not present

## 2022-05-22 DIAGNOSIS — Z736 Limitation of activities due to disability: Secondary | ICD-10-CM | POA: Diagnosis not present

## 2022-05-22 DIAGNOSIS — D649 Anemia, unspecified: Secondary | ICD-10-CM | POA: Diagnosis not present

## 2022-05-22 DIAGNOSIS — C9000 Multiple myeloma not having achieved remission: Secondary | ICD-10-CM | POA: Diagnosis not present

## 2022-05-22 DIAGNOSIS — E785 Hyperlipidemia, unspecified: Secondary | ICD-10-CM | POA: Diagnosis not present

## 2022-05-22 DIAGNOSIS — I10 Essential (primary) hypertension: Secondary | ICD-10-CM | POA: Diagnosis not present

## 2022-05-23 DIAGNOSIS — D649 Anemia, unspecified: Secondary | ICD-10-CM | POA: Diagnosis not present

## 2022-05-23 DIAGNOSIS — R739 Hyperglycemia, unspecified: Secondary | ICD-10-CM | POA: Diagnosis not present

## 2022-05-23 DIAGNOSIS — Z736 Limitation of activities due to disability: Secondary | ICD-10-CM | POA: Diagnosis not present

## 2022-05-23 DIAGNOSIS — R262 Difficulty in walking, not elsewhere classified: Secondary | ICD-10-CM | POA: Diagnosis not present

## 2022-05-23 DIAGNOSIS — M6281 Muscle weakness (generalized): Secondary | ICD-10-CM | POA: Diagnosis not present

## 2022-05-23 DIAGNOSIS — E785 Hyperlipidemia, unspecified: Secondary | ICD-10-CM | POA: Diagnosis not present

## 2022-05-23 DIAGNOSIS — I10 Essential (primary) hypertension: Secondary | ICD-10-CM | POA: Diagnosis not present

## 2022-05-23 DIAGNOSIS — M48062 Spinal stenosis, lumbar region with neurogenic claudication: Secondary | ICD-10-CM | POA: Diagnosis not present

## 2022-05-23 DIAGNOSIS — C9000 Multiple myeloma not having achieved remission: Secondary | ICD-10-CM | POA: Diagnosis not present

## 2022-05-26 DIAGNOSIS — D649 Anemia, unspecified: Secondary | ICD-10-CM | POA: Diagnosis not present

## 2022-05-26 DIAGNOSIS — Z736 Limitation of activities due to disability: Secondary | ICD-10-CM | POA: Diagnosis not present

## 2022-05-26 DIAGNOSIS — R799 Abnormal finding of blood chemistry, unspecified: Secondary | ICD-10-CM | POA: Diagnosis not present

## 2022-05-26 DIAGNOSIS — M6281 Muscle weakness (generalized): Secondary | ICD-10-CM | POA: Diagnosis not present

## 2022-05-26 DIAGNOSIS — I10 Essential (primary) hypertension: Secondary | ICD-10-CM | POA: Diagnosis not present

## 2022-05-26 DIAGNOSIS — R739 Hyperglycemia, unspecified: Secondary | ICD-10-CM | POA: Diagnosis not present

## 2022-05-26 DIAGNOSIS — R262 Difficulty in walking, not elsewhere classified: Secondary | ICD-10-CM | POA: Diagnosis not present

## 2022-05-26 DIAGNOSIS — M48062 Spinal stenosis, lumbar region with neurogenic claudication: Secondary | ICD-10-CM | POA: Diagnosis not present

## 2022-05-26 DIAGNOSIS — C9000 Multiple myeloma not having achieved remission: Secondary | ICD-10-CM | POA: Diagnosis not present

## 2022-05-28 DIAGNOSIS — D649 Anemia, unspecified: Secondary | ICD-10-CM | POA: Diagnosis not present

## 2022-05-28 DIAGNOSIS — R739 Hyperglycemia, unspecified: Secondary | ICD-10-CM | POA: Diagnosis not present

## 2022-05-28 DIAGNOSIS — I10 Essential (primary) hypertension: Secondary | ICD-10-CM | POA: Diagnosis not present

## 2022-05-28 DIAGNOSIS — R799 Abnormal finding of blood chemistry, unspecified: Secondary | ICD-10-CM | POA: Diagnosis not present

## 2022-05-29 DIAGNOSIS — R799 Abnormal finding of blood chemistry, unspecified: Secondary | ICD-10-CM | POA: Diagnosis not present

## 2022-05-29 DIAGNOSIS — D649 Anemia, unspecified: Secondary | ICD-10-CM | POA: Diagnosis not present

## 2022-05-29 DIAGNOSIS — R739 Hyperglycemia, unspecified: Secondary | ICD-10-CM | POA: Diagnosis not present

## 2022-05-29 DIAGNOSIS — I10 Essential (primary) hypertension: Secondary | ICD-10-CM | POA: Diagnosis not present

## 2022-05-30 DIAGNOSIS — R799 Abnormal finding of blood chemistry, unspecified: Secondary | ICD-10-CM | POA: Diagnosis not present

## 2022-05-30 DIAGNOSIS — R739 Hyperglycemia, unspecified: Secondary | ICD-10-CM | POA: Diagnosis not present

## 2022-05-30 DIAGNOSIS — E119 Type 2 diabetes mellitus without complications: Secondary | ICD-10-CM | POA: Diagnosis not present

## 2022-05-30 DIAGNOSIS — D649 Anemia, unspecified: Secondary | ICD-10-CM | POA: Diagnosis not present

## 2022-06-02 DIAGNOSIS — R799 Abnormal finding of blood chemistry, unspecified: Secondary | ICD-10-CM | POA: Diagnosis not present

## 2022-06-02 DIAGNOSIS — D649 Anemia, unspecified: Secondary | ICD-10-CM | POA: Diagnosis not present

## 2022-06-02 DIAGNOSIS — E119 Type 2 diabetes mellitus without complications: Secondary | ICD-10-CM | POA: Diagnosis not present

## 2022-06-02 DIAGNOSIS — I10 Essential (primary) hypertension: Secondary | ICD-10-CM | POA: Diagnosis not present

## 2022-06-03 DIAGNOSIS — R799 Abnormal finding of blood chemistry, unspecified: Secondary | ICD-10-CM | POA: Diagnosis not present

## 2022-06-03 DIAGNOSIS — E162 Hypoglycemia, unspecified: Secondary | ICD-10-CM | POA: Diagnosis not present

## 2022-06-03 DIAGNOSIS — D649 Anemia, unspecified: Secondary | ICD-10-CM | POA: Diagnosis not present

## 2022-06-03 DIAGNOSIS — E119 Type 2 diabetes mellitus without complications: Secondary | ICD-10-CM | POA: Diagnosis not present

## 2022-06-04 DIAGNOSIS — E162 Hypoglycemia, unspecified: Secondary | ICD-10-CM | POA: Diagnosis not present

## 2022-06-04 DIAGNOSIS — R4182 Altered mental status, unspecified: Secondary | ICD-10-CM | POA: Diagnosis not present

## 2022-06-04 DIAGNOSIS — D649 Anemia, unspecified: Secondary | ICD-10-CM | POA: Diagnosis not present

## 2022-06-04 DIAGNOSIS — E119 Type 2 diabetes mellitus without complications: Secondary | ICD-10-CM | POA: Diagnosis not present

## 2022-06-04 DIAGNOSIS — M6281 Muscle weakness (generalized): Secondary | ICD-10-CM | POA: Diagnosis not present

## 2022-06-04 DIAGNOSIS — G9340 Encephalopathy, unspecified: Secondary | ICD-10-CM | POA: Diagnosis not present

## 2022-06-05 DIAGNOSIS — E162 Hypoglycemia, unspecified: Secondary | ICD-10-CM | POA: Diagnosis not present

## 2022-06-05 DIAGNOSIS — E119 Type 2 diabetes mellitus without complications: Secondary | ICD-10-CM | POA: Diagnosis not present

## 2022-06-05 DIAGNOSIS — R799 Abnormal finding of blood chemistry, unspecified: Secondary | ICD-10-CM | POA: Diagnosis not present

## 2022-06-05 DIAGNOSIS — D649 Anemia, unspecified: Secondary | ICD-10-CM | POA: Diagnosis not present

## 2022-06-06 DIAGNOSIS — E785 Hyperlipidemia, unspecified: Secondary | ICD-10-CM | POA: Diagnosis present

## 2022-06-06 DIAGNOSIS — I361 Nonrheumatic tricuspid (valve) insufficiency: Secondary | ICD-10-CM | POA: Diagnosis not present

## 2022-06-06 DIAGNOSIS — J9601 Acute respiratory failure with hypoxia: Secondary | ICD-10-CM | POA: Diagnosis not present

## 2022-06-06 DIAGNOSIS — R1310 Dysphagia, unspecified: Secondary | ICD-10-CM | POA: Diagnosis not present

## 2022-06-06 DIAGNOSIS — Z7982 Long term (current) use of aspirin: Secondary | ICD-10-CM | POA: Diagnosis not present

## 2022-06-06 DIAGNOSIS — Z66 Do not resuscitate: Secondary | ICD-10-CM | POA: Diagnosis not present

## 2022-06-06 DIAGNOSIS — I071 Rheumatic tricuspid insufficiency: Secondary | ICD-10-CM | POA: Diagnosis not present

## 2022-06-06 DIAGNOSIS — E119 Type 2 diabetes mellitus without complications: Secondary | ICD-10-CM | POA: Diagnosis not present

## 2022-06-06 DIAGNOSIS — R2990 Unspecified symptoms and signs involving the nervous system: Secondary | ICD-10-CM | POA: Diagnosis not present

## 2022-06-06 DIAGNOSIS — N179 Acute kidney failure, unspecified: Secondary | ICD-10-CM | POA: Diagnosis not present

## 2022-06-06 DIAGNOSIS — I272 Pulmonary hypertension, unspecified: Secondary | ICD-10-CM | POA: Diagnosis not present

## 2022-06-06 DIAGNOSIS — G931 Anoxic brain damage, not elsewhere classified: Secondary | ICD-10-CM | POA: Diagnosis not present

## 2022-06-06 DIAGNOSIS — Z931 Gastrostomy status: Secondary | ICD-10-CM | POA: Diagnosis not present

## 2022-06-06 DIAGNOSIS — F17210 Nicotine dependence, cigarettes, uncomplicated: Secondary | ICD-10-CM | POA: Diagnosis present

## 2022-06-06 DIAGNOSIS — E559 Vitamin D deficiency, unspecified: Secondary | ICD-10-CM | POA: Diagnosis not present

## 2022-06-06 DIAGNOSIS — Z79899 Other long term (current) drug therapy: Secondary | ICD-10-CM | POA: Diagnosis not present

## 2022-06-06 DIAGNOSIS — D630 Anemia in neoplastic disease: Secondary | ICD-10-CM | POA: Diagnosis present

## 2022-06-06 DIAGNOSIS — I462 Cardiac arrest due to underlying cardiac condition: Secondary | ICD-10-CM | POA: Diagnosis not present

## 2022-06-06 DIAGNOSIS — C9000 Multiple myeloma not having achieved remission: Secondary | ICD-10-CM | POA: Diagnosis not present

## 2022-06-06 DIAGNOSIS — J9600 Acute respiratory failure, unspecified whether with hypoxia or hypercapnia: Secondary | ICD-10-CM | POA: Diagnosis not present

## 2022-06-06 DIAGNOSIS — R579 Shock, unspecified: Secondary | ICD-10-CM | POA: Diagnosis not present

## 2022-06-06 DIAGNOSIS — E875 Hyperkalemia: Secondary | ICD-10-CM | POA: Diagnosis not present

## 2022-06-06 DIAGNOSIS — I110 Hypertensive heart disease with heart failure: Secondary | ICD-10-CM | POA: Diagnosis present

## 2022-06-06 DIAGNOSIS — J189 Pneumonia, unspecified organism: Secondary | ICD-10-CM | POA: Diagnosis not present

## 2022-06-06 DIAGNOSIS — I5022 Chronic systolic (congestive) heart failure: Secondary | ICD-10-CM | POA: Diagnosis not present

## 2022-06-06 DIAGNOSIS — J9602 Acute respiratory failure with hypercapnia: Secondary | ICD-10-CM | POA: Diagnosis not present

## 2022-06-06 DIAGNOSIS — I469 Cardiac arrest, cause unspecified: Secondary | ICD-10-CM | POA: Diagnosis not present

## 2022-06-06 DIAGNOSIS — K7200 Acute and subacute hepatic failure without coma: Secondary | ICD-10-CM | POA: Diagnosis not present

## 2022-06-06 DIAGNOSIS — R778 Other specified abnormalities of plasma proteins: Secondary | ICD-10-CM | POA: Diagnosis present

## 2022-06-24 DEATH — deceased

## 2023-04-09 IMAGING — RF DG SWALLOWING FUNCTION
12 of 23 series · 12 of 24 positions shown · non-contrast
Comparison: None Available.

CLINICAL DATA: possible TIA in July 2021, recently developed
excessive tiredness and placed on B12, and developed dysphagia

EXAM:
MODIFIED BARIUM SWALLOW
TECHNIQUE: Different consistencies of barium were administered orally to the
patient by the Speech Pathologist. Imaging of the pharynx was
performed in the lateral projection. Lenny Healey was present in the
fluoroscopy room during this study, which was supervised and
interpreted by Dr. Ummulkairi.
FLUOROSCOPY:
Radiation Exposure Index (as provided by the fluoroscopic device):
38.58 mGy Kerma

[Series 2: run · 1 of 17 frames shown (1 of 12)]
[frame 3/17]
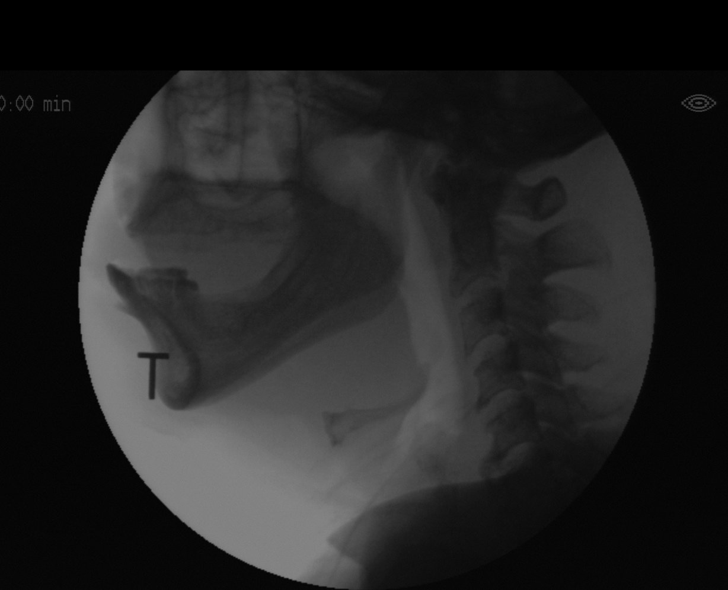

[Series 4: run · 1 of 316 frames shown (2 of 12)]
[frame 1/316]
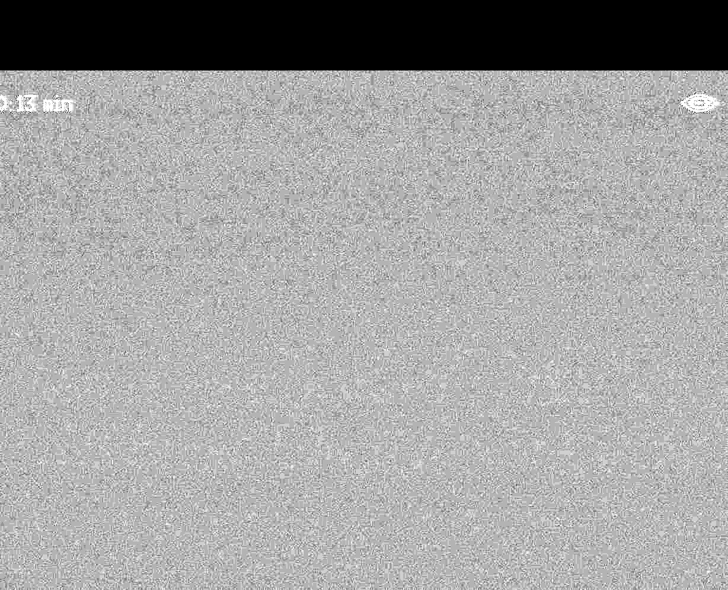

[Series 6: run · 1 of 170 frames shown (3 of 12)]
[frame 26/170]
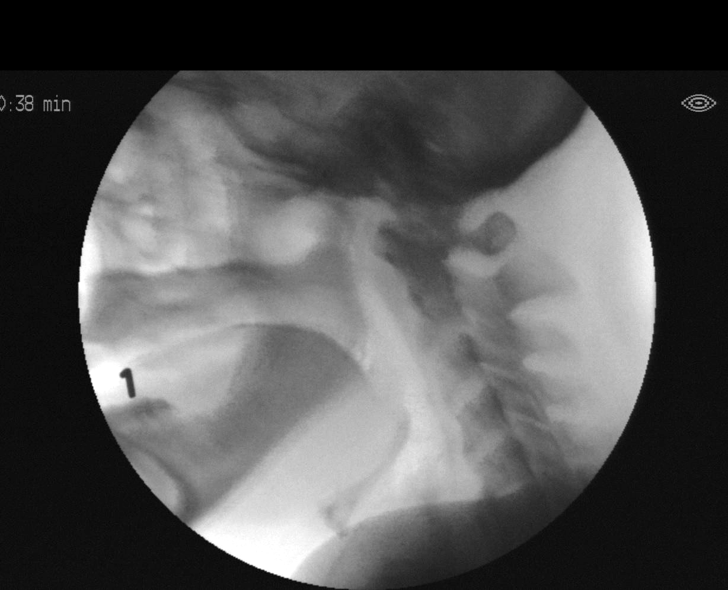

[Series 7: run · 1 of 220 frames shown (4 of 12)]
[frame 188/220]
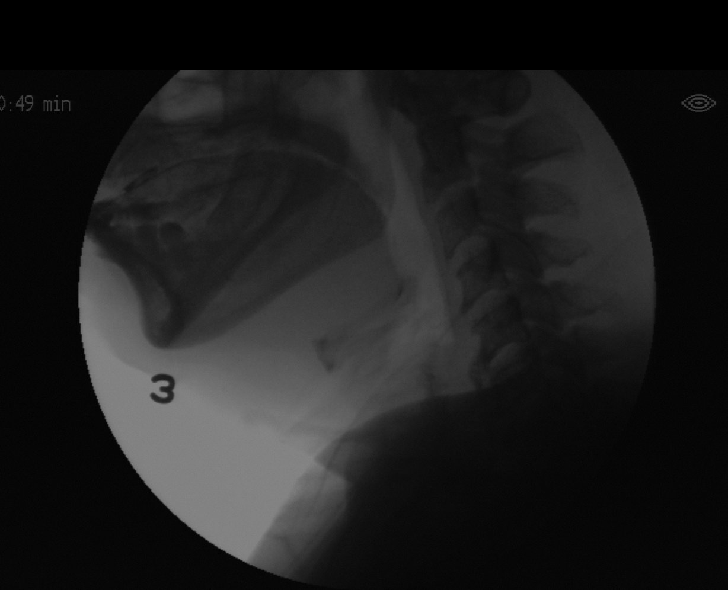

[Series 9: run · 1 of 430 frames shown (5 of 12)]
[frame 366/430]
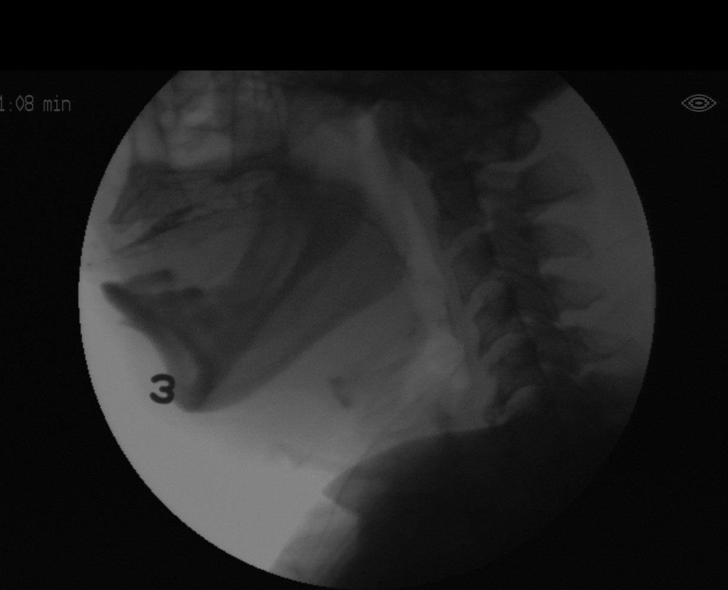

[Series 11: run · 1 of 507 frames shown (6 of 12)]
[frame 431/507]
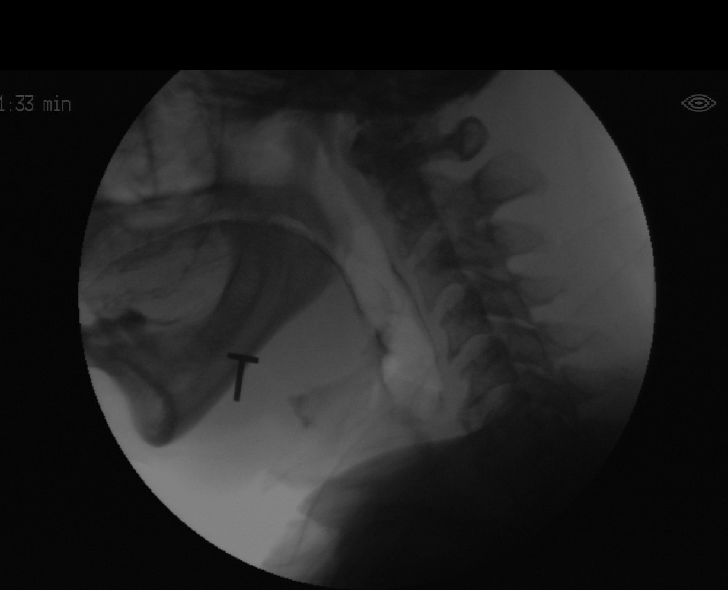

[Series 13: run · 1 of 69 frames shown (7 of 12)]
[frame 59/69]
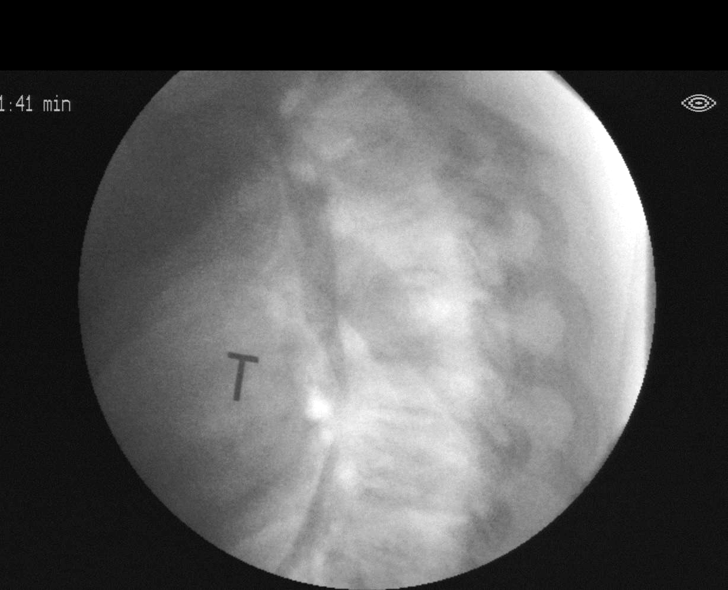

[Series 15: run · 1 of 102 frames shown (8 of 12)]
[frame 87/102]
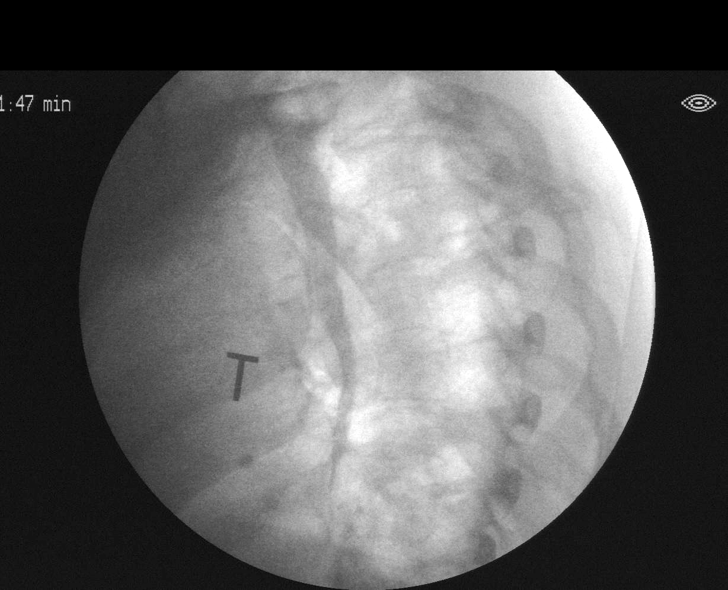

[Series 17: run · 1 of 351 frames shown (9 of 12)]
[frame 299/351]
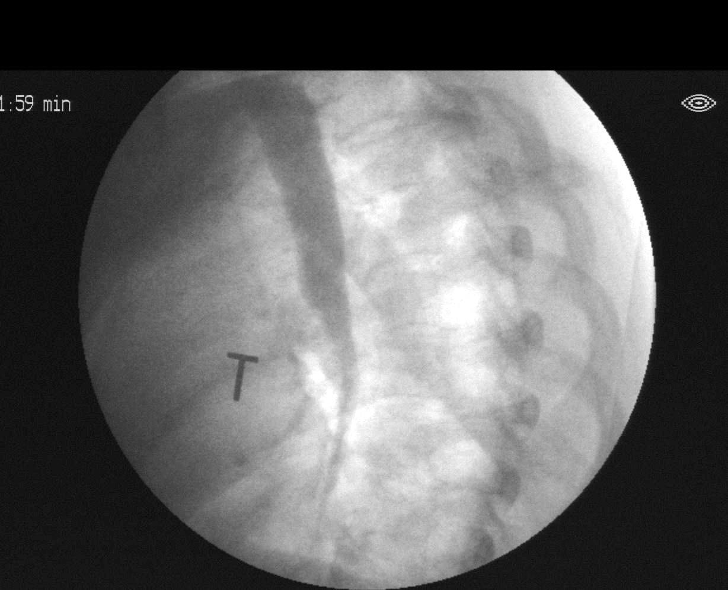

[Series 19: run · 1 of 46 frames shown (10 of 12)]
[frame 40/46]
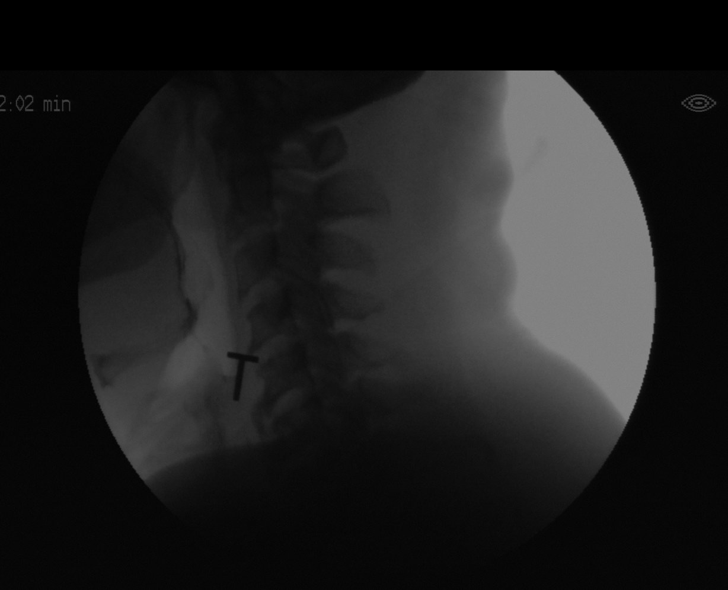

[Series 21: run · 1 of 152 frames shown (11 of 12)]
[frame 139/152]
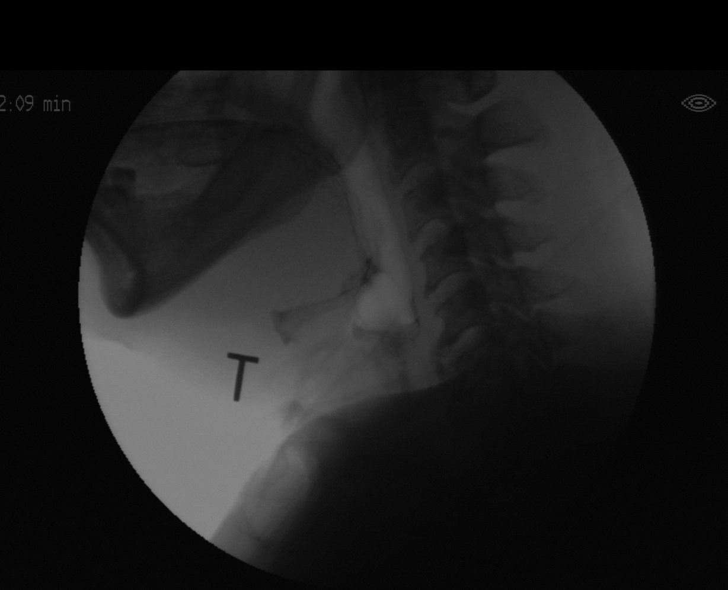

[Series 23: run · 1 of 60 frames shown (12 of 12)]
[frame 57/60]
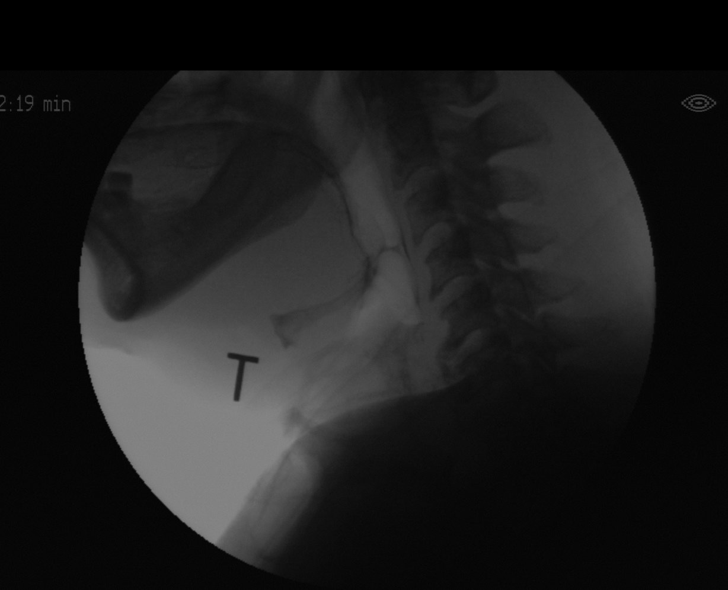

[12 of 24 positions shown; findings below may reference images not displayed]

FINDINGS: Vestibular  Penetration:  Mild, with thin consistency

Aspiration:  None seen.

Other: Limited primary peristalsis through the visualized portion of
the thoracic esophagus with slow clearance of swallowed material.
IMPRESSION: 1. Mild laryngeal penetration with thin consistency fluid. No
aspiration.
2. Esophageal dysmotility, incompletely evaluated

Please refer to the Speech Pathologists report for complete details
and recommendations.
